# Patient Record
Sex: Female | Born: 1950
Health system: Southern US, Community
[De-identification: ages and names within clinical notes are randomized; demographics above are authoritative.]

## PROBLEM LIST (undated history)

## (undated) DIAGNOSIS — J45909 Unspecified asthma, uncomplicated: Secondary | ICD-10-CM

## (undated) DIAGNOSIS — E039 Hypothyroidism, unspecified: Secondary | ICD-10-CM

## (undated) DIAGNOSIS — Z8679 Personal history of other diseases of the circulatory system: Secondary | ICD-10-CM

## (undated) DIAGNOSIS — M542 Cervicalgia: Secondary | ICD-10-CM

## (undated) DIAGNOSIS — I209 Angina pectoris, unspecified: Secondary | ICD-10-CM

## (undated) HISTORY — PX: BREAST SURGERY: SHX581

## (undated) HISTORY — DX: Unspecified asthma, uncomplicated: J45.909

## (undated) HISTORY — DX: Angina pectoris, unspecified: I20.9

## (undated) HISTORY — DX: Personal history of other diseases of the circulatory system: Z86.79

## (undated) HISTORY — PX: TUBAL LIGATION: SHX77

## (undated) HISTORY — DX: Cervicalgia: M54.2

## (undated) HISTORY — DX: Hypothyroidism, unspecified: E03.9

## (undated) HISTORY — PX: LASER ABLATION: SHX1947

---

## 2004-11-05 ENCOUNTER — Emergency Department (HOSPITAL_COMMUNITY): Admission: EM | Admit: 2004-11-05 | Discharge: 2004-11-05 | Payer: Self-pay | Admitting: Family Medicine

## 2005-01-20 ENCOUNTER — Ambulatory Visit: Payer: Self-pay | Admitting: Internal Medicine

## 2005-01-24 ENCOUNTER — Ambulatory Visit: Payer: Self-pay | Admitting: Internal Medicine

## 2005-02-27 ENCOUNTER — Ambulatory Visit: Payer: Self-pay | Admitting: Cardiology

## 2005-04-10 ENCOUNTER — Ambulatory Visit: Payer: Self-pay | Admitting: Internal Medicine

## 2005-05-09 ENCOUNTER — Ambulatory Visit: Payer: Self-pay | Admitting: Internal Medicine

## 2005-05-29 ENCOUNTER — Encounter: Admission: RE | Admit: 2005-05-29 | Discharge: 2005-05-29 | Payer: Self-pay | Admitting: Internal Medicine

## 2005-07-16 ENCOUNTER — Ambulatory Visit: Payer: Self-pay | Admitting: Internal Medicine

## 2005-07-25 ENCOUNTER — Other Ambulatory Visit: Admission: RE | Admit: 2005-07-25 | Discharge: 2005-07-25 | Payer: Self-pay | Admitting: Obstetrics and Gynecology

## 2005-09-22 ENCOUNTER — Ambulatory Visit: Payer: Self-pay | Admitting: Cardiology

## 2005-10-03 ENCOUNTER — Ambulatory Visit: Payer: Self-pay | Admitting: Internal Medicine

## 2005-10-09 ENCOUNTER — Encounter: Admission: RE | Admit: 2005-10-09 | Discharge: 2005-10-09 | Payer: Self-pay | Admitting: Orthopedic Surgery

## 2006-06-23 ENCOUNTER — Ambulatory Visit: Payer: Self-pay | Admitting: Cardiology

## 2006-09-07 ENCOUNTER — Ambulatory Visit: Payer: Self-pay | Admitting: Internal Medicine

## 2006-09-10 ENCOUNTER — Ambulatory Visit: Payer: Self-pay | Admitting: Internal Medicine

## 2006-09-23 ENCOUNTER — Ambulatory Visit: Payer: Self-pay | Admitting: Internal Medicine

## 2006-09-23 LAB — CONVERTED CEMR LAB
Albumin: 4.1 g/dL (ref 3.5–5.2)
Alkaline Phosphatase: 107 units/L (ref 39–117)
BUN: 13 mg/dL (ref 6–23)
CO2: 28 meq/L (ref 19–32)
Creatinine, Ser: 0.9 mg/dL (ref 0.4–1.2)
HCT: 37.9 % (ref 36.0–46.0)
MCHC: 33.3 g/dL (ref 30.0–36.0)
Platelets: 247 10*3/uL (ref 150–400)
RDW: 13.5 % (ref 11.5–14.6)
Sodium: 142 meq/L (ref 135–145)
Total Bilirubin: 1 mg/dL (ref 0.3–1.2)
Total Protein: 7.3 g/dL (ref 6.0–8.3)
WBC: 6.2 10*3/uL (ref 4.5–10.5)

## 2006-12-07 ENCOUNTER — Ambulatory Visit: Payer: Self-pay | Admitting: Psychology

## 2006-12-14 ENCOUNTER — Ambulatory Visit: Payer: Self-pay | Admitting: Psychology

## 2006-12-18 ENCOUNTER — Ambulatory Visit: Payer: Self-pay | Admitting: Psychology

## 2006-12-30 ENCOUNTER — Ambulatory Visit: Payer: Self-pay | Admitting: Psychology

## 2007-01-01 ENCOUNTER — Ambulatory Visit: Payer: Self-pay | Admitting: Internal Medicine

## 2007-01-08 ENCOUNTER — Ambulatory Visit: Payer: Self-pay | Admitting: Psychology

## 2007-03-18 ENCOUNTER — Ambulatory Visit: Payer: Self-pay | Admitting: Pulmonary Disease

## 2007-03-18 LAB — CONVERTED CEMR LAB
ALT: 14 units/L (ref 0–40)
AST: 20 units/L (ref 0–37)
Alkaline Phosphatase: 100 units/L (ref 39–117)
BUN: 14 mg/dL (ref 6–23)
Bilirubin, Direct: 0.2 mg/dL (ref 0.0–0.3)
CO2: 27 meq/L (ref 19–32)
Calcium: 9.2 mg/dL (ref 8.4–10.5)
Chloride: 105 meq/L (ref 96–112)
Creatinine, Ser: 0.8 mg/dL (ref 0.4–1.2)
Glucose, Bld: 103 mg/dL — ABNORMAL HIGH (ref 70–99)
Total Bilirubin: 0.8 mg/dL (ref 0.3–1.2)
Total Protein: 7.4 g/dL (ref 6.0–8.3)

## 2007-04-26 ENCOUNTER — Encounter: Payer: Self-pay | Admitting: Internal Medicine

## 2007-04-26 DIAGNOSIS — J45909 Unspecified asthma, uncomplicated: Secondary | ICD-10-CM | POA: Insufficient documentation

## 2007-04-26 DIAGNOSIS — I471 Supraventricular tachycardia: Secondary | ICD-10-CM | POA: Insufficient documentation

## 2007-04-26 DIAGNOSIS — I209 Angina pectoris, unspecified: Secondary | ICD-10-CM

## 2007-04-26 DIAGNOSIS — Z8679 Personal history of other diseases of the circulatory system: Secondary | ICD-10-CM

## 2007-04-26 HISTORY — DX: Angina pectoris, unspecified: I20.9

## 2007-04-26 HISTORY — DX: Unspecified asthma, uncomplicated: J45.909

## 2007-04-26 HISTORY — DX: Personal history of other diseases of the circulatory system: Z86.79

## 2007-06-15 ENCOUNTER — Ambulatory Visit: Payer: Self-pay | Admitting: Internal Medicine

## 2007-06-15 DIAGNOSIS — H10509 Unspecified blepharoconjunctivitis, unspecified eye: Secondary | ICD-10-CM | POA: Insufficient documentation

## 2007-06-21 ENCOUNTER — Ambulatory Visit: Payer: Self-pay | Admitting: Cardiology

## 2008-02-01 ENCOUNTER — Ambulatory Visit: Payer: Self-pay | Admitting: Internal Medicine

## 2008-02-01 DIAGNOSIS — M542 Cervicalgia: Secondary | ICD-10-CM

## 2008-02-01 HISTORY — DX: Cervicalgia: M54.2

## 2008-05-16 ENCOUNTER — Telehealth: Payer: Self-pay | Admitting: Adult Health

## 2008-08-31 ENCOUNTER — Ambulatory Visit: Payer: Self-pay | Admitting: Cardiology

## 2009-01-30 ENCOUNTER — Ambulatory Visit: Payer: Self-pay | Admitting: Internal Medicine

## 2009-02-14 ENCOUNTER — Encounter: Payer: Self-pay | Admitting: Internal Medicine

## 2009-02-22 ENCOUNTER — Telehealth: Payer: Self-pay | Admitting: Internal Medicine

## 2009-02-26 ENCOUNTER — Encounter: Payer: Self-pay | Admitting: Cardiology

## 2010-01-04 ENCOUNTER — Encounter: Payer: Self-pay | Admitting: Internal Medicine

## 2010-01-10 ENCOUNTER — Encounter: Payer: Self-pay | Admitting: Internal Medicine

## 2010-02-07 DIAGNOSIS — E039 Hypothyroidism, unspecified: Secondary | ICD-10-CM

## 2010-02-07 HISTORY — DX: Hypothyroidism, unspecified: E03.9

## 2010-02-11 ENCOUNTER — Ambulatory Visit: Payer: Self-pay | Admitting: Cardiology

## 2010-09-10 ENCOUNTER — Ambulatory Visit: Payer: Self-pay | Admitting: Internal Medicine

## 2010-11-07 ENCOUNTER — Ambulatory Visit
Admission: RE | Admit: 2010-11-07 | Discharge: 2010-11-07 | Payer: Self-pay | Source: Home / Self Care | Attending: Internal Medicine | Admitting: Internal Medicine

## 2010-11-07 LAB — CONVERTED CEMR LAB
Ketones, urine, test strip: NEGATIVE
Nitrite: NEGATIVE
Specific Gravity, Urine: 1.025
Urobilinogen, UA: 0.2

## 2010-11-24 ENCOUNTER — Encounter: Payer: Self-pay | Admitting: Internal Medicine

## 2010-12-03 NOTE — Assessment & Plan Note (Signed)
Summary: f1y/jss  Medications Added NITROLINGUAL 0.4 MG/SPRAY SOLN (NITROGLYCERIN) One spray under tongue every 5 minutes as needed for chest pain---may repeat times three NITROSTAT 0.4 MG SUBL (NITROGLYCERIN) 1 tablet under tongue at onset of chest pain; you may repeat every 5 minutes for up to 3 doses. PROPRANOLOL HCL 40 MG TABS (PROPRANOLOL HCL) as needed      Allergies Added: NKDA  Visit Type:  1 year follow up  CC:  No cardiac complains.  History of Present Illness: She has had two episodes of chest pain, both with positive enzymes, one in [place], and one at [place] in [place].  She had chest pain, the second time were more classic.  Cath revealed no sig CAD.  Both times called spasm.  Has had RFA of SVT and been under the care of Dr. Celedonio Miyamoto.    Current Medications (verified): 1)  Cartia Xt 180 Mg Cp24 (Diltiazem Hcl Coated Beads) .Marland Kitchen.. 1 Once Daily 2)  Synthroid 100 Mcg Tabs (Levothyroxine Sodium) .Marland Kitchen.. 1 Once Daily 3)  Tramadol Hcl 50 Mg Tabs (Tramadol Hcl) .... Take 1 Tablet By Mouth Every Six Hours 4)  Alprazolam 0.5 Mg  Tabs (Alprazolam) .Marland Kitchen.. 1 Two Times A Day As Needed 5)  Proair Hfa 108 (90 Base) Mcg/act  Aers (Albuterol Sulfate) .... Use As Dir 6)  Advair Diskus 250-50 Mcg/dose Misc (Fluticasone-Salmeterol) .... Use As Directed 7)  Nitrolingual 0.4 Mg/spray Soln (Nitroglycerin) .... One Spray Under Tongue Every 5 Minutes As Needed For Chest Pain---May Repeat Times Three 8)  Propranolol Hcl 40 Mg Tabs (Propranolol Hcl) .... As Needed  Allergies (verified): No Known Drug Allergies  Past History:  Past Medical History: Last updated: February 25, 2010 Current Problems:  SUPRAVENTRICULAR TACHYCARDIA, HX OF (ICD-V12.59) CORONARY ARTERY SPASM (ICD-413.9) NECK PAIN (ICD-723.1) BLEPHAROCONJUNCTIVITIS NOS (ICD-372.20) ASTHMA (ICD-493.90) HYPOTHYROIDISM (ICD-244.9)  Past Surgical History: Last updated: February 25, 2010 Tubal ligation Breast biopsy  ablations x2  Family  History: Last updated: February 25, 2010 Father died at age 89 of chronic obstructive pulmonary disease, and mother died at age 8 of cancer. She has 2 brothers who are alive and well. She also has a sister who is alive.  Social History: Last updated: 02-25-2010  She is married to the Microbiologist of [employer].  She has a former career in respiratory therapy, drinks some wine, no tobacco or drugs.  Vital Signs:  Patient profile:   60 year old female Height:      64 inches Weight:      160.50 pounds BMI:     27.65 Pulse rate:   70 / minute Pulse rhythm:   regular Resp:     18 per minute BP sitting:   120 / 76  (left arm) Cuff size:   regular  Vitals Entered By: Vikki Ports (February 11, 2010 4:19 PM)  Physical Exam  General:  Well developed, well nourished, in no acute distress. Head:  normocephalic and atraumatic Eyes:  PERRLA/EOM intact; conjunctiva and lids normal. Lungs:  Clear bilaterally to auscultation and percussion. Heart:  PMI non displaced.  Normal S1 and S2.   Msk:  Back normal, normal gait. Muscle strength and tone normal. Pulses:  pulses normal in all 4 extremities Extremities:  No clubbing or cyanosis. Neurologic:  Alert and oriented x 3.   EKG  Procedure date:  02/11/2010  Findings:      NSR.  Nonspecific ST abnormality  Impression & Recommendations:  Problem # 1:  SUPRAVENTRICULAR TACHYCARDIA, HX OF (ICD-V12.59)  No real  occurence.   Her updated medication list for this problem includes:    Cartia Xt 180 Mg Cp24 (Diltiazem hcl coated beads) .Marland Kitchen... 1 once daily    Nitrostat 0.4 Mg Subl (Nitroglycerin) .Marland Kitchen... 1 tablet under tongue at onset of chest pain; you may repeat every 5 minutes for up to 3 doses.    Propranolol Hcl 40 Mg Tabs (Propranolol hcl) .Marland Kitchen... As needed  Orders: EKG w/ Interpretation (93000)  Problem # 2:  CORONARY ARTERY SPASM (ICD-413.9)  Not clear about spasm or not.  Could have been another mechanism.   Her updated  medication list for this problem includes:    Cartia Xt 180 Mg Cp24 (Diltiazem hcl coated beads) .Marland Kitchen... 1 once daily    Nitrostat 0.4 Mg Subl (Nitroglycerin) .Marland Kitchen... 1 tablet under tongue at onset of chest pain; you may repeat every 5 minutes for up to 3 doses.    Propranolol Hcl 40 Mg Tabs (Propranolol hcl) .Marland Kitchen... As needed  Orders: EKG w/ Interpretation (93000)  Patient Instructions: 1)  Your physician recommends that you schedule a follow-up appointment in: 1year 2)  Your physician recommends that you continue on your current medications as directed. Please refer to the Current Medication list given to you today. Prescriptions: PROPRANOLOL HCL 40 MG TABS (PROPRANOLOL HCL) as needed  #30 x 2   Entered by:   Lisabeth Devoid RN   Authorized by:   Ronaldo Miyamoto, MD, [facility]   Signed by:   Lisabeth Devoid RN on 02/11/2010   Method used:   Electronically to        [facility]* (retail)       [address]       [address] Shipping/mailing       [place], [place]  [address]       Ph: [phone]       Fax: [phone]   RxID:   [id number] NITROSTAT 0.4 MG SUBL (NITROGLYCERIN) 1 tablet under tongue at onset of chest pain; you may repeat every 5 minutes for up to 3 doses.  #25 x 2   Entered by:   Lisabeth Devoid RN   Authorized by:   Ronaldo Miyamoto, MD, [facility]   Signed by:   Lisabeth Devoid RN on 02/11/2010   Method used:   Electronically to        [facility]* (retail)       [address]       [address] Shipping/mailing       [place], [place]  [address]       Ph: [phone]       Fax: [phone]   RxID:   [id number]

## 2010-12-03 NOTE — Assessment & Plan Note (Signed)
Summary: MED CHECK/REFILLS/CJR   Vital Signs:  Patient profile:   60 year old female Weight:      164 pounds Temp:     98.1 degrees F oral BP sitting:   114 / 70  (right arm) Cuff size:   regular  Vitals Entered By: Duard [place] LPN (September 10, 2010 12:49 PM) CC: medication review with refills Is Patient Diabetic? No Flu Vaccine Consent Questions     Do you have a history of severe allergic reactions to this vaccine? no    Any prior history of allergic reactions to egg and/or gelatin? no    Do you have a sensitivity to the preservative Thimersol? no    Do you have a past history of Guillan-Barre Syndrome? no    Do you currently have an acute febrile illness? no    Have you ever had a severe reaction to latex? no    Vaccine information given and explained to patient? yes    Are you currently pregnant? no    Lot Number:[id number]   Exp Date:05/03/2011   Site Given  right Deltoid IM   CC:  medication review with refills.  History of Present Illness: 60 year old patient who is followed by cardiology due to SVT status post RFA x 2.  Kristin Leonard has a history of coronary artery spasm.  She is doing quite well today.  She needs medication refills.  She has a history of asthma and hypothyroidism.  Preventive Screening-Counseling & Management  Alcohol-Tobacco     Smoking Status: never  Allergies (verified): No Known Drug Allergies  Past History:  Past Medical History: Reviewed history from 02/07/2010 and no changes required. Current Problems:  SUPRAVENTRICULAR TACHYCARDIA, HX OF (ICD-V12.59) CORONARY ARTERY SPASM (ICD-413.9) NECK PAIN (ICD-723.1) BLEPHAROCONJUNCTIVITIS NOS (ICD-372.20) ASTHMA (ICD-493.90) HYPOTHYROIDISM (ICD-244.9)  Review of Systems  The patient denies anorexia, fever, weight loss, weight gain, vision loss, decreased hearing, hoarseness, chest pain, syncope, dyspnea on exertion, peripheral edema, prolonged cough, headaches, hemoptysis, abdominal  pain, melena, hematochezia, severe indigestion/heartburn, hematuria, incontinence, genital sores, muscle weakness, suspicious skin lesions, transient blindness, difficulty walking, depression, unusual weight change, abnormal bleeding, enlarged lymph nodes, angioedema, and breast masses.    Physical Exam  General:  Well-developed,well-nourished,in no acute distress; alert,appropriate and cooperative throughout examination Head:  Normocephalic and atraumatic without obvious abnormalities. No apparent alopecia or balding. Mouth:  Oral mucosa and oropharynx without lesions or exudates.  Teeth in good repair. Neck:  No deformities, masses, or tenderness noted. Lungs:  Normal respiratory effort, chest expands symmetrically. Lungs are clear to auscultation, no crackles or wheezes. Heart:  Normal rate and regular rhythm. S1 and S2 normal without gallop, murmur, click, rub or other extra sounds. Abdomen:  Bowel sounds positive,abdomen soft and non-tender without masses, organomegaly or hernias noted. Msk:  No deformity or scoliosis noted of thoracic or lumbar spine.   Pulses:  R and L carotid,radial,femoral,dorsalis pedis and posterior tibial pulses are full and equal bilaterally Extremities:  No clubbing, cyanosis, edema, or deformity noted with normal full range of motion of all joints.     Impression & Recommendations:  Problem # 1:  CORONARY ARTERY SPASM (ICD-413.9)  Her updated medication list for this problem includes:    Cartia Xt 180 Mg Cp24 (Diltiazem hcl coated beads) .Marland Kitchen... 1 once daily    Nitrostat 0.4 Mg Subl (Nitroglycerin) .Marland Kitchen... 1 tablet under tongue at onset of chest pain; you may repeat every 5 minutes for up to 3 doses.    Propranolol Hcl 40  Mg Tabs (Propranolol hcl) .Marland Kitchen... As needed  Problem # 2:  ASTHMA (ICD-493.90)  Her updated medication list for this problem includes:    Proair Hfa 108 (90 Base) Mcg/act Aers (Albuterol sulfate) ..... Use as dir    Advair Diskus 250-50  Mcg/dose Misc (Fluticasone-salmeterol) ..... Use as directed  Problem # 3:  HYPOTHYROIDISM (ICD-244.9)  Her updated medication list for this problem includes:    Synthroid 100 Mcg Tabs (Levothyroxine sodium) .Marland Kitchen... 1 once daily  Complete Medication List: 1)  Cartia Xt 180 Mg Cp24 (Diltiazem hcl coated beads) .Marland Kitchen.. 1 once daily 2)  Synthroid 100 Mcg Tabs (Levothyroxine sodium) .Marland Kitchen.. 1 once daily 3)  Tramadol Hcl 50 Mg Tabs (Tramadol hcl) .... Take 1 tablet by mouth every six hours 4)  Alprazolam 0.5 Mg Tabs (Alprazolam) .Marland Kitchen.. 1 two times a day as needed 5)  Proair Hfa 108 (90 Base) Mcg/act Aers (Albuterol sulfate) .... Use as dir 6)  Advair Diskus 250-50 Mcg/dose Misc (Fluticasone-salmeterol) .... Use as directed 7)  Nitrostat 0.4 Mg Subl (Nitroglycerin) .Marland Kitchen.. 1 tablet under tongue at onset of chest pain; you may repeat every 5 minutes for up to 3 doses. 8)  Propranolol Hcl 40 Mg Tabs (Propranolol hcl) .... As needed  Other Orders: Admin 1st Vaccine (10272) Flu Vaccine 52yrs + (53664)  Patient Instructions: 1)  Please schedule a follow-up appointment in 6 months. 2)  Limit your Sodium (Salt) to less than 2 grams a day(slightly less than 1/2 a teaspoon) to prevent fluid retention, swelling, or worsening of symptoms. 3)  It is important that you exercise regularly at least 20 minutes 5 times a week. If you develop chest pain, have severe difficulty breathing, or feel very tired , stop exercising immediately and seek medical attention. Prescriptions: ADVAIR DISKUS 250-50 MCG/DOSE MISC (FLUTICASONE-SALMETEROL) Use as directed  #3 x 3   Entered and Authorized by:   Gordy Savers  MD   Signed by:   Gordy Savers  MD on 09/10/2010   Method used:   Electronically to        [facility]* (retail)       [address]       [address] Shipping/mailing       [place], [place]  [address]       Ph: [phone]       Fax: [phone]   RxID:   [id number] PROAIR HFA  108 (90 BASE) MCG/ACT  AERS (ALBUTEROL SULFATE) use as dir Brand medically necessary #3 Pack x 1   Entered and Authorized by:   Gordy Savers  MD   Signed by:   Gordy Savers  MD on 09/10/2010   Method used:   Electronically to        [facility]* (retail)       [address]       [address] Shipping/mailing       [place], [place]  [address]       Ph: [phone]       Fax: [phone]   RxID:   [id number] TRAMADOL HCL 50 MG TABS (TRAMADOL HCL) Take 1 tablet by mouth every six hours  #90 x 6   Entered and Authorized by:   Gordy Savers  MD   Signed by:   Gordy Savers  MD on 09/10/2010   Method used:   Electronically to        [facility]* (  retail)       [address]       [address] Shipping/mailing       [place], [place]  [address]       Ph: [phone]       Fax: [phone]   RxID:   [id number] SYNTHROID 100 MCG TABS (LEVOTHYROXINE SODIUM) 1 once daily  #90 x 4   Entered and Authorized by:   Gordy Savers  MD   Signed by:   Gordy Savers  MD on 09/10/2010   Method used:   Electronically to        [facility]* (retail)       [address]       [address] Shipping/mailing       [place], [place]  [address]       Ph: [phone]       Fax: [phone]   RxID:   [id number] CARTIA XT 180 MG CP24 (DILTIAZEM HCL COATED BEADS) 1 once daily  #90 x 4   Entered and Authorized by:   Gordy Savers  MD   Signed by:   Gordy Savers  MD on 09/10/2010   Method used:   Electronically to        [facility]* (retail)       [address]       [address] Shipping/mailing       [place], [place]  [address]       Ph: [phone]       Fax: [phone]   RxID:   [id number] ADVAIR DISKUS 250-50 MCG/DOSE MISC (FLUTICASONE-SALMETEROL) Use as directed  #3 x 3   Entered and Authorized by:   Gordy Savers  MD   Signed by:   Gordy Savers  MD on 09/10/2010   Method used:   Print then Mail to Patient   RxID:   [id number] PROAIR HFA 108 (90 BASE) MCG/ACT  AERS (ALBUTEROL SULFATE) use as dir Brand medically necessary #3 Pack x 1   Entered and Authorized by:   Gordy Savers  MD   Signed by:   Gordy Savers  MD on 09/10/2010   Method used:   Print then Mail to Patient   RxID:   [id number] ALPRAZOLAM 0.5 MG  TABS (ALPRAZOLAM) 1 two times a day as needed  #60 x 1   Entered and Authorized by:   Gordy Savers  MD   Signed by:   Gordy Savers  MD on 09/10/2010   Method used:   Print then Mail to Patient   RxID:   [id number] TRAMADOL HCL 50 MG TABS (TRAMADOL HCL) Take 1 tablet by mouth every six hours  #90 x 6   Entered and Authorized by:   Gordy Savers  MD   Signed by:   Gordy Savers  MD on 09/10/2010   Method used:   Print then Mail to Patient   RxID:   [id number] SYNTHROID 100 MCG TABS (LEVOTHYROXINE SODIUM) 1 once daily  #90 x 4   Entered and Authorized by:   Gordy Savers  MD   Signed by:   Gordy Savers  MD on 09/10/2010   Method used:   Print then Mail to Patient   RxID:   [id number] CARTIA XT 180 MG CP24 (DILTIAZEM HCL COATED BEADS) 1 once daily  #90 x 4   Entered and Authorized  by:   Gordy Savers  MD   Signed by:   Gordy Savers  MD on 09/10/2010   Method used:   Print then Mail to Patient   RxID:   [id number]    Orders Added: 1)  Admin 1st Vaccine [90471] 2)  Flu Vaccine 46yrs + [14782] 3)  Est. Patient Level III [95621]

## 2010-12-04 ENCOUNTER — Encounter: Payer: Self-pay | Admitting: Internal Medicine

## 2010-12-04 ENCOUNTER — Ambulatory Visit (INDEPENDENT_AMBULATORY_CARE_PROVIDER_SITE_OTHER): Payer: 59 | Admitting: Internal Medicine

## 2010-12-04 VITALS — BP 120/80 | HR 66 | Temp 98.2°F | Resp 16 | Ht 63.75 in | Wt 166.0 lb

## 2010-12-04 DIAGNOSIS — J069 Acute upper respiratory infection, unspecified: Secondary | ICD-10-CM

## 2010-12-04 MED ORDER — HYDROCODONE-HOMATROPINE 5-1.5 MG/5ML PO SYRP: 5.0000 mL | ORAL_SOLUTION | Freq: Four times a day (QID) | ORAL | Status: AC | PRN

## 2010-12-04 NOTE — Patient Instructions (Signed)
Return in 3 months for follow-up Get plenty of rest, Drink lots of  clear liquids, and use Tylenol or ibuprofen for fever and discomfort.

## 2010-12-04 NOTE — Progress Notes (Signed)
  Subjective:    Patient ID: Kristin Leonard, female    DOB: August 24, 1951, 60 y.o.   MRN: [id number]  HPI  60 -year-old patient who has a  history of asthma.  The last 5 days.  She has had head congestion, sore throat, and nonproductive cough.  The cough is worse at night.  She has been using albuterol prophylactically, but there is been no wheezing.  She has felt poorly with fatigue for 5 days.  Associated symptoms include hoarseness.  Denies any fever or chills.   Review of Systems  Constitutional: Negative.   HENT: Positive for congestion, sore throat, voice change and sinus pressure. Negative for hearing loss, facial swelling, rhinorrhea, mouth sores, trouble swallowing, neck pain, neck stiffness, dental problem, tinnitus and ear discharge.   Eyes: Negative for pain, discharge and visual disturbance.  Respiratory: Positive for cough. Negative for shortness of breath and wheezing.   Cardiovascular: Negative for chest pain, palpitations and leg swelling.  Gastrointestinal: Negative for nausea, vomiting, abdominal pain, diarrhea, constipation, blood in stool and abdominal distention.  Genitourinary: Negative for dysuria, urgency, frequency, hematuria, flank pain, vaginal bleeding, vaginal discharge, difficulty urinating, vaginal pain and pelvic pain.  Musculoskeletal: Negative for joint swelling, arthralgias and gait problem.  Skin: Negative for rash.  Neurological: Negative for dizziness, syncope, speech difficulty, weakness, numbness and headaches.  Hematological: Negative for adenopathy. Does not bruise/bleed easily.  Psychiatric/Behavioral: Negative for behavioral problems, dysphoric mood and agitation. The patient is not nervous/anxious.        Objective:   Physical Exam  Constitutional: She is oriented to person, place, and time. She appears well-developed and well-nourished.  HENT:  Head: Normocephalic.  Right Ear: External ear normal.  Left Ear: External ear normal.       Slight  injection of the oropharynx  Eyes: Conjunctivae and EOM are normal. Pupils are equal, round, and reactive to light.  Neck: Normal range of motion. Neck supple. No thyromegaly present.  Cardiovascular: Normal rate, regular rhythm, normal heart sounds and intact distal pulses.   Pulmonary/Chest: Effort normal and breath sounds normal.  Abdominal: Soft. Bowel sounds are normal. She exhibits no mass. There is no tenderness.  Musculoskeletal: Normal range of motion.  Lymphadenopathy:    She has no cervical adenopathy.  Neurological: She is alert and oriented to person, place, and time.  Skin: Skin is warm and dry. No rash noted.  Psychiatric: She has a normal mood and affect. Her behavior is normal.          Assessment & Plan:  1. URI.  Will treat symptomatically.  Also, given a prescription for Hydromet to assist with her most problematic symptom which is cough nocturnally.  Will force fluids 2. Asthma-Presently  stable in spite of recent URI; Will continue  maintenance medications

## 2010-12-05 NOTE — Assessment & Plan Note (Signed)
Summary: UA/dm   Allergies: No Known Drug Allergies   Complete Medication List: 1)  Cartia Xt 180 Mg Cp24 (Diltiazem hcl coated beads) .Marland Kitchen.. 1 once daily 2)  Synthroid 100 Mcg Tabs (Levothyroxine sodium) .Marland Kitchen.. 1 once daily 3)  Tramadol Hcl 50 Mg Tabs (Tramadol hcl) .... Take 1 tablet by mouth every six hours 4)  Alprazolam 0.5 Mg Tabs (Alprazolam) .Marland Kitchen.. 1 two times a day as needed 5)  Proair Hfa 108 (90 Base) Mcg/act Aers (Albuterol sulfate) .... Use as dir 6)  Advair Diskus 250-50 Mcg/dose Misc (Fluticasone-salmeterol) .... Use as directed 7)  Nitrostat 0.4 Mg Subl (Nitroglycerin) .Marland Kitchen.. 1 tablet under tongue at onset of chest pain; you may repeat every 5 minutes for up to 3 doses. 8)  Propranolol Hcl 40 Mg Tabs (Propranolol hcl) .... As needed  Other Orders: Specimen Handling (47829) UA Dipstick w/o Micro (automated)  (81003)   Orders Added: 1)  Specimen Handling [99000] 2)  UA Dipstick w/o Micro (automated)  [81003]    Laboratory Results   Urine Tests    Routine Urinalysis   Color: yellow Appearance: Clear Glucose: negative   (Normal Range: Negative) Bilirubin: negative   (Normal Range: Negative) Ketone: negative   (Normal Range: Negative) Spec. Gravity: 1.025   (Normal Range: 1.003-1.035) Blood: trace-intact   (Normal Range: Negative) pH: 6.5   (Normal Range: 5.0-8.0) Protein: negative   (Normal Range: Negative) Urobilinogen: 0.2   (Normal Range: 0-1) Nitrite: negative   (Normal Range: Negative) Leukocyte Esterace: trace   (Normal Range: Negative)    Comments: Rita Ohara  November 07, 2010 3:09 PM

## 2010-12-09 ENCOUNTER — Telehealth: Payer: Self-pay | Admitting: *Deleted

## 2010-12-09 NOTE — Telephone Encounter (Signed)
Pt states she will get samples at her office at [facility].

## 2010-12-09 NOTE — Telephone Encounter (Signed)
Pt would like an antibiotic.  She is still coughing with an loose, non productive cough and sore throat.

## 2010-12-09 NOTE — Telephone Encounter (Signed)
Patient has viral URI- continue present Rx; does she need additional  Cough meds?  Call if she develops fever, wheezing of sob

## 2010-12-11 NOTE — Assessment & Plan Note (Signed)
Summary: sinusitis?   Allergies: No Known Drug Allergies   Complete Medication List: 1)  Cartia Xt 180 Mg Cp24 (Diltiazem hcl coated beads) .Marland Kitchen.. 1 once daily 2)  Synthroid 100 Mcg Tabs (Levothyroxine sodium) .Marland Kitchen.. 1 once daily 3)  Tramadol Hcl 50 Mg Tabs (Tramadol hcl) .... Take 1 tablet by mouth every six hours 4)  Alprazolam 0.5 Mg Tabs (Alprazolam) .Marland Kitchen.. 1 two times a day as needed 5)  Proair Hfa 108 (90 Base) Mcg/act Aers (Albuterol sulfate) .... Use as dir 6)  Advair Diskus 250-50 Mcg/dose Misc (Fluticasone-salmeterol) .... Use as directed 7)  Nitrostat 0.4 Mg Subl (Nitroglycerin) .Marland Kitchen.. 1 tablet under tongue at onset of chest pain; you may repeat every 5 minutes for up to 3 doses. 8)  Propranolol Hcl 40 Mg Tabs (Propranolol hcl) .... As needed 9)  Ciprofloxacin Hcl 500 Mg Tabs (Ciprofloxacin hcl) .... One twice daily

## 2010-12-23 ENCOUNTER — Emergency Department (HOSPITAL_BASED_OUTPATIENT_CLINIC_OR_DEPARTMENT_OTHER)
Admission: EM | Admit: 2010-12-23 | Discharge: 2010-12-23 | Disposition: A | Discharge status: Home / Self Care | Payer: 59 | Attending: Emergency Medicine | Admitting: Emergency Medicine

## 2010-12-23 ENCOUNTER — Emergency Department (INDEPENDENT_AMBULATORY_CARE_PROVIDER_SITE_OTHER): Payer: 59

## 2010-12-23 DIAGNOSIS — Y92009 Unspecified place in unspecified non-institutional (private) residence as the place of occurrence of the external cause: Secondary | ICD-10-CM | POA: Insufficient documentation

## 2010-12-23 DIAGNOSIS — S82409A Unspecified fracture of shaft of unspecified fibula, initial encounter for closed fracture: Secondary | ICD-10-CM

## 2010-12-23 DIAGNOSIS — S82109A Unspecified fracture of upper end of unspecified tibia, initial encounter for closed fracture: Secondary | ICD-10-CM | POA: Insufficient documentation

## 2010-12-23 DIAGNOSIS — W19XXXA Unspecified fall, initial encounter: Secondary | ICD-10-CM

## 2010-12-24 ENCOUNTER — Ambulatory Visit (HOSPITAL_COMMUNITY)
Admission: RE | Admit: 2010-12-24 | Discharge: 2010-12-24 | Disposition: A | Discharge status: Home / Self Care | Payer: 59 | Source: Ambulatory Visit | Attending: Orthopedic Surgery | Admitting: Orthopedic Surgery

## 2010-12-24 ENCOUNTER — Other Ambulatory Visit (HOSPITAL_COMMUNITY): Payer: Self-pay | Admitting: Orthopedic Surgery

## 2010-12-24 DIAGNOSIS — S8000XA Contusion of unspecified knee, initial encounter: Secondary | ICD-10-CM | POA: Insufficient documentation

## 2010-12-24 DIAGNOSIS — S82409A Unspecified fracture of shaft of unspecified fibula, initial encounter for closed fracture: Secondary | ICD-10-CM | POA: Insufficient documentation

## 2010-12-24 DIAGNOSIS — R52 Pain, unspecified: Secondary | ICD-10-CM

## 2010-12-24 DIAGNOSIS — X58XXXA Exposure to other specified factors, initial encounter: Secondary | ICD-10-CM | POA: Insufficient documentation

## 2010-12-24 DIAGNOSIS — M25569 Pain in unspecified knee: Secondary | ICD-10-CM | POA: Insufficient documentation

## 2010-12-24 DIAGNOSIS — S838X9A Sprain of other specified parts of unspecified knee, initial encounter: Secondary | ICD-10-CM | POA: Insufficient documentation

## 2010-12-25 ENCOUNTER — Other Ambulatory Visit (HOSPITAL_COMMUNITY): Payer: Self-pay | Admitting: Orthopedic Surgery

## 2010-12-25 DIAGNOSIS — M25561 Pain in right knee: Secondary | ICD-10-CM

## 2011-01-02 ENCOUNTER — Other Ambulatory Visit (HOSPITAL_COMMUNITY): Payer: 59

## 2011-02-19 ENCOUNTER — Other Ambulatory Visit: Payer: Self-pay | Admitting: *Deleted

## 2011-02-19 MED ORDER — PROPRANOLOL HCL 40 MG PO TABS: 40.0000 mg | ORAL_TABLET | Freq: Three times a day (TID) | ORAL | Status: DC

## 2011-02-20 ENCOUNTER — Other Ambulatory Visit: Payer: Self-pay

## 2011-02-20 MED ORDER — ALPRAZOLAM 0.5 MG PO TABS: 0.5000 mg | ORAL_TABLET | Freq: Two times a day (BID) | ORAL | Status: DC | PRN

## 2011-02-20 NOTE — Telephone Encounter (Signed)
[facility] 

## 2011-03-18 ENCOUNTER — Ambulatory Visit: Payer: Self-pay | Admitting: Internal Medicine

## 2011-03-18 DIAGNOSIS — Z0289 Encounter for other administrative examinations: Secondary | ICD-10-CM

## 2011-03-18 NOTE — Assessment & Plan Note (Signed)
[facility]                            CARDIOLOGY OFFICE NOTE   Kristin Leonard, Kristin Leonard                    MRN:          [id number]  DATE:06/21/2007                            DOB:          November 24, 1950    Kristin Leonard is in for a followup visit.  In general she has been doing  extremely well.  She has not had any recurrent chest pain.  She asks me  about the possibility of coming off her Imdur.  We talked about that as  well.  Since I last her she has seen Dr. __________ .  The patient has  been on Cardizem CD and has not had significant recurrence of  arrhythmia.  They have recommended continuing that.  She has not been  started back on Rythmol.   CURRENT MEDICATIONS:  1. Imdur 30 mg daily.  2. Synthroid 0.1 mg daily.  3. Cardizem long acting 180 mg daily.   PHYSICAL EXAMINATION:  Blood pressure is 100/88, pulse is 72.  LUNGS:  Fields are clear.  CARDIAC:  Rhythm is regular.   Her EKG reveals normal sinus rhythm and is essentially within normal  limits.   IMPRESSION:  1. History of possible coronary artery spasm in the past.  2. History of supraventricular tachycardia, status post ablation, on      Cardizem therapy.  3. History of hyperthyroidism, status post radioactive ablation now on      replacement therapy.   RECOMMENDATIONS:  1. Continue current medical regimen.  2. She will go down to Imdur 15 mg daily for the next 2 months and      then stop it all together.  He has a prescription for nitroglycerin      to be used as needed.     Kristin Morton. Riley Kill, MD, [facility]  Electronically Signed    TDS/MedQ  DD: 06/21/2007  DT: 06/21/2007  Job #: [id number]

## 2011-03-18 NOTE — Assessment & Plan Note (Signed)
[facility]                            CARDIOLOGY OFFICE NOTE   Kristin Leonard, Kristin Leonard                    MRN:          [id number]  DATE:08/31/2008                            DOB:          1951-01-08    Kristin Leonard is in for followup.  She is really doing quite well.  She  may have had one episode since I saw her last of either SVT or chest  pain.  She did have an episode of chest pain, but none of the associated  symptoms are similar to what she had at [facility].  She denies any  ongoing current symptoms.   Her medications include Synthroid 0.1 mg daily and Cardizem 180 mg  daily.   On physical, her blood pressure is 110/78, pulse 64.  The lung fields  are clear, and the cardiac rhythm is regular.   The electrocardiogram demonstrates normal sinus rhythm and essentially  within normal limits.   IMPRESSION:  1. Status post ablation by Dr. __________ with an ectopic atrial      tachycardia or an atrial flutter with variable block.  2. Question history of coronary spasm.   PLAN:  1. Continue current medical regimen.  2. Return to clinic in 1 year.  3. Continued followup with her primary care, Dr. Amador Leonard and [facility]      Arturo Morton. Riley Kill, MD, [facility]  Electronically Signed    TDS/MedQ  DD: 08/31/2008  DT: 09/01/2008  Job #: [id number]   cc:   Gordy Savers, MD

## 2011-03-21 NOTE — Assessment & Plan Note (Signed)
[facility]                              CARDIOLOGY OFFICE NOTE   DELIA, SLATTEN                    MRN:          [id number]  DATE:06/23/2006                            DOB:          [birth year]    HISTORY OF PRESENT ILLNESS:  Mrs. Kristin Leonard is in for a follow up visit.  She  has not been having any ongoing chest pain.  She feels well.  She has been  taking dual antispasm therapy.  She is now down to Imdur 30 mg daily, with  Cardizem 180 mg daily as well as Synthroid 0.1 mg daily.  She is followed by  Dr. Amador Cunas.  She has not had recurrent arrhythmia as well, and saw Dr.  Lorre Munroe at the [facility] [place].   PHYSICAL EXAMINATION:  VITAL SIGNS:  Blood pressure 120/78, pulse 76.  LUNGS:  Fields are clear.  CARDIAC:  Rhythm is regular.  No significant murmurs appreciated.   STUDIES:  Electrocardiogram demonstrates normal sinus rhythm, essentially  within normal limits.  The QT interval is normal.   IMPRESSION:  1. History of coronary artery spasms with documented electrocardiographic      changes, positive enzymes, and no significant coronary obstruction      without evidence of __________.  2. History of supraventricular tachycardia status post electrophysiologic      study and ablation therapy in October 2003.   PLAN:  1. Continue current medical regimen.  2. Return to clinic in one year.  3. Renew nitroglycerin prescription.                              Arturo Morton. Riley Kill, MD, [facility]    TDS/MedQ  DD:  06/23/2006  DT:  06/24/2006  Job #:  [id number]

## 2011-03-21 NOTE — Assessment & Plan Note (Signed)
Kristin Leonard                           Kristin Leonard   Kristin Leonard, Kristin Leonard                    MRN:          [id number]  DATE:09/07/2006                            DOB:          1950/12/16    CHIEF COMPLAINT:  Left lower quadrant pain.   HISTORY:  A 60 year old white woman that has had several months of a vague  left lower quadrant pain becoming more constant, saw her gynecologist, Dr.  Renaldo Leonard, had an ultrasound that showed some fibroids but normal ovaries.  There are no bowel habit changes, bleeding, etc.  Eating or position changes  do not seem to affect it.  Sort of a nagging pain.   MEDICATIONS:  1. Imdur 30 mg daily.  2. Synthroid 0.1 mg daily.  3. Cardizem 180 mg daily.  4. Albuterol p.r.n.  5. Nitroglycerin p.r.n.  6. Xanax p.r.n.   DRUG ALLERGIES:  None known.   PAST MEDICAL HISTORY:  1. Coronary spasm problems.  2. SET with ablations x2 followed at [facility] of [place].  3. Asthma.  4. History of hyperthyroidism status post radioactive ablation, now on      replacement therapy.  5. Prior tubal ligation, 1988.   FAMILY HISTORY:  No colon cancer, and our review was otherwise negative.   SOCIAL HISTORY:  She is married to the Kristin of [employer]  Leonard.  She has a former career in respiratory therapy, drinks some wine,  no tobacco or drugs.   REVIEW OF SYSTEMS:  Eye-glasses.  Allergy problems.  Joint pain.  Some back  pain problems.  All other systems negative.   PHYSICAL EXAMINATION:  GENERAL:  Well-developed, well-nourished white woman  in no acute distress.  VITAL SIGNS:  Height 5 feet 4 inches.  Weight 155 pounds, blood pressure  106/70, pulse 80.  EYES:  Anicteric.  ENT:  Normal nodes, lymphs, ears.  NECK:  Supple.  LUNGS:  Clear.  HEART:  S1, S2, no rubs or gallops.  ABDOMEN:  Soft.  There is some mild tenderness in the left lower quadrant  and groin area.  There is no obvious  hernia.  There is no mass.  RECTAL:  Deferred to colonoscopy.  EXTREMITIES:  No edema noted in the upper extremities.  NEUROLOGIC/PSYCH:  She is alert and oriented x3.   ASSESSMENT:  1. Left lower quadrant pain, unclear etiology.  Pelvic ultrasound      unrevealing.  There are no associated colonic symptoms.  2. Over 57 of age for colon cancer screening.   PLAN:  Investigate symptoms and screen with colonoscopy.  If the colonoscopy  is unrevealing, I would probably monitor things.  It may be a  musculoskeletal process.   I have explained the risks, benefits, indications of the procedure.  She  understands and agrees to proceed.     Kristin Boop, MD,FACG  Electronically Signed    CEG/MedQ  DD: 09/07/2006  DT: 09/07/2006  Job #: [id number]   cc:   Kristin Savers, MD  Kristin Cairo, MD

## 2011-04-15 ENCOUNTER — Inpatient Hospital Stay (INDEPENDENT_AMBULATORY_CARE_PROVIDER_SITE_OTHER)
Admission: RE | Admit: 2011-04-15 | Discharge: 2011-04-15 | Disposition: A | Discharge status: Home / Self Care | Payer: 59 | Source: Ambulatory Visit | Attending: Emergency Medicine | Admitting: Emergency Medicine

## 2011-04-15 DIAGNOSIS — H109 Unspecified conjunctivitis: Secondary | ICD-10-CM

## 2011-08-13 ENCOUNTER — Encounter: Payer: Self-pay | Admitting: Cardiology

## 2011-08-13 ENCOUNTER — Ambulatory Visit (INDEPENDENT_AMBULATORY_CARE_PROVIDER_SITE_OTHER): Payer: 59 | Admitting: Cardiology

## 2011-08-13 DIAGNOSIS — I059 Rheumatic mitral valve disease, unspecified: Secondary | ICD-10-CM

## 2011-08-13 DIAGNOSIS — I209 Angina pectoris, unspecified: Secondary | ICD-10-CM

## 2011-08-13 DIAGNOSIS — Z8679 Personal history of other diseases of the circulatory system: Secondary | ICD-10-CM

## 2011-08-13 DIAGNOSIS — I341 Nonrheumatic mitral (valve) prolapse: Secondary | ICD-10-CM

## 2011-08-13 NOTE — Progress Notes (Signed)
HPI:  She is doing well.  She is not having any issues at present.  No chest pain and no recurrent SVT.  Has not had echo in many years, and does carry diagnosis of MVP.  No exertional shortness of breath.   Current Outpatient Prescriptions  Medication Sig Dispense Refill  . albuterol (PROAIR HFA) 108 (90 BASE) MCG/ACT inhaler Inhale 2 puffs into the lungs every 6 (six) hours as needed.        . ALPRAZolam (XANAX) 0.5 MG tablet Take 1 tablet (0.5 mg total) by mouth 2 (two) times daily as needed.  60 tablet  1  . diltiazem (CARDIZEM CD) 180 MG 24 hr capsule Take 180 mg by mouth daily.        Marland Kitchen levothyroxine (SYNTHROID, LEVOTHROID) 100 MCG tablet Take 100 mcg by mouth daily.        . nitroGLYCERIN (NITROSTAT) 0.4 MG SL tablet Place 0.4 mg under the tongue every 5 (five) minutes as needed. Not to exceed 3 doses       . propranolol (INDERAL) 40 MG tablet Take 1 tablet (40 mg total) by mouth 3 (three) times daily. As needed  30 tablet  2  . traMADol (ULTRAM) 50 MG tablet Take 50 mg by mouth every 6 (six) hours as needed.          No Known Allergies  Past Medical History  Diagnosis Date  . HYPOTHYROIDISM 02/07/2010  . CORONARY ARTERY SPASM 04/26/2007  . ASTHMA 04/26/2007  . NECK PAIN 02/01/2008  . SUPRAVENTRICULAR TACHYCARDIA, HX OF 04/26/2007    Past Surgical History  Procedure Date  . Tubal ligation   . Breast surgery     biopsy  . Laser ablation     x2    No family history on file.  History   Social History  . Marital Status: Married    Spouse Name: N/A    Number of Children: N/A  . Years of Education: N/A   Occupational History  . Not on file.   Social History Main Topics  . Smoking status: Never Smoker   . Smokeless tobacco: Not on file  . Alcohol Use: 1.8 oz/week    3 Glasses of wine per week  . Drug Use: No  . Sexually Active:    Other Topics Concern  . Not on file   Social History Narrative  . No narrative on file    ROS: Please see the HPI.  All other  systems reviewed and negative.  PHYSICAL EXAM:  BP 118/80  Pulse 68  Resp 18  Ht 5\' 4"  (1.626 m)  Wt 176 lb 1.9 oz (79.888 kg)  BMI 30.23 kg/m2  General: Well developed, well nourished, in no acute distress. Head:  Normocephalic and atraumatic. Neck: no JVD Lungs: Clear to auscultation and percussion. Heart: Normal S1 and S2. Prominent click without a definite murmur.   Pulses: Pulses normal in all 4 extremities. Extremities: No clubbing or cyanosis. No edema. Neurologic: Alert and oriented x 3.  EKG:  NSR.  Non specific inferior and anterior ST and T wave changes.  T waves are slightly more prominent than 2011, but asymptomatic and non specific.    ASSESSMENT AND PLAN:

## 2011-08-13 NOTE — Assessment & Plan Note (Signed)
Has prominent click on exam.  No current symptoms.  Will recheck echo in light of abnormal ECG.

## 2011-08-13 NOTE — Assessment & Plan Note (Addendum)
Denies any chest pain. Her ECG compared to before shows subtle ECG changes.  I went back and reviewed all of her previous ECGs, dating back to 2006.  There is some T flattening one year ago, but slightly more now.  Will schedule both routine GXT and also get echo  (for abnormal ECG and also MVP history).

## 2011-08-13 NOTE — Patient Instructions (Signed)
Your physician has requested that you have an echocardiogram. Echocardiography is a painless test that uses sound waves to create images of your heart. It provides your doctor with information about the size and shape of your heart and how well your heart's chambers and valves are working. This procedure takes approximately one hour. There are no restrictions for this procedure.  Your physician wants you to follow-up in: 1 YEAR You will receive a reminder letter in the mail two months in advance. If you don't receive a letter, please call our office to schedule the follow-up appointment.   Your physician recommends that you continue on your current medications as directed. Please refer to the Current Medication list given to you today.  

## 2011-08-13 NOTE — Assessment & Plan Note (Signed)
No recurrent symptoms.

## 2011-08-15 ENCOUNTER — Telehealth: Payer: Self-pay | Admitting: Cardiology

## 2011-08-15 DIAGNOSIS — I059 Rheumatic mitral valve disease, unspecified: Secondary | ICD-10-CM

## 2011-08-15 DIAGNOSIS — R9431 Abnormal electrocardiogram [ECG] [EKG]: Secondary | ICD-10-CM

## 2011-08-15 NOTE — Telephone Encounter (Signed)
Kristin Leonard I spoke with her by phone. She is agreeable for a routine GXT (POET). Please schedule within the next month.  Thanks   TS   I spoke with the pt and she would like to get GXT and Echo scheduled on the same day.  I will have Lela call the pt to schedule these tests. Instruction sheet mailed to the pt for GXT.

## 2011-08-15 NOTE — Telephone Encounter (Signed)
Pt returning call from Dr. Riley Kill on Tuesday night to set up treadmill test. Please return pt call to discuss further.

## 2011-08-21 ENCOUNTER — Other Ambulatory Visit (HOSPITAL_COMMUNITY): Payer: 59 | Admitting: Radiology

## 2011-09-02 ENCOUNTER — Ambulatory Visit (INDEPENDENT_AMBULATORY_CARE_PROVIDER_SITE_OTHER): Payer: 59 | Admitting: Cardiology

## 2011-09-02 ENCOUNTER — Ambulatory Visit (HOSPITAL_COMMUNITY): Payer: 59 | Attending: Cardiology | Admitting: Radiology

## 2011-09-02 DIAGNOSIS — R9431 Abnormal electrocardiogram [ECG] [EKG]: Secondary | ICD-10-CM | POA: Insufficient documentation

## 2011-09-02 DIAGNOSIS — I471 Supraventricular tachycardia, unspecified: Secondary | ICD-10-CM | POA: Insufficient documentation

## 2011-09-02 DIAGNOSIS — J45909 Unspecified asthma, uncomplicated: Secondary | ICD-10-CM | POA: Insufficient documentation

## 2011-09-02 DIAGNOSIS — I341 Nonrheumatic mitral (valve) prolapse: Secondary | ICD-10-CM

## 2011-09-02 DIAGNOSIS — I059 Rheumatic mitral valve disease, unspecified: Secondary | ICD-10-CM | POA: Insufficient documentation

## 2011-09-02 DIAGNOSIS — I209 Angina pectoris, unspecified: Secondary | ICD-10-CM

## 2011-09-02 NOTE — Progress Notes (Signed)
Exercise Treadmill Test  Pre-Exercise Testing Evaluation Rhythm: normal sinus  Rate: 62   PR:  18 QRS:  .09  QT:  .44 QTc: .45     Test  Exercise Tolerance Test Ordering MD: Shawnie Pons, MD  Interpreting MD:  Shawnie Pons, MD  Unique Test No: 1  Treadmill:  1  Indication for ETT: known ASHD  Contraindication to ETT: No   Stress Modality: exercise - treadmill  Cardiac Imaging Performed: non   Protocol: standard Bruce - maximal  Max BP  136/94Max MPHR (bpm):  161 85% MPR (bpm):  136  MPHR obtained (bpm):  136 % MPHR obtained:85  Reached 85% MPHR (min:sec): 8:50 Total Exercise Time (min-sec):  9:00  Workload in METS:  10.1 Borg Scale:15  Reason ETT Terminated:  fatigue    ST Segment Analysis At Rest: non-specific ST segment slurring With Exercise: no evidence of significant ST depression  Other Information Arrhythmia:  No Angina during ETT:  absent (0) Quality of ETT:  diagnostic  ETT Interpretation:  normal - no evidence of ischemia by ST analysis  Comments: She exercised on the Bruce protocol without chest pain.  There was minor J point depression without ST depression.  No angina.  Negative for ischemia.    Recommendations: Review echocardiogram.  No significant changes.

## 2011-09-15 ENCOUNTER — Telehealth: Payer: Self-pay | Admitting: Cardiology

## 2011-09-15 NOTE — Telephone Encounter (Signed)
Left patient a message to call back.

## 2011-09-15 NOTE — Telephone Encounter (Signed)
Pt calling wanting to know results of pt ECHO. Please return call to discuss further.  

## 2011-09-16 MED ORDER — PROPRANOLOL HCL 40 MG PO TABS: 40.0000 mg | ORAL_TABLET | Freq: Three times a day (TID) | ORAL | Status: DC

## 2011-09-16 MED ORDER — NITROGLYCERIN 0.4 MG SL SUBL: 0.4000 mg | SUBLINGUAL_TABLET | SUBLINGUAL | Status: DC | PRN

## 2011-09-16 NOTE — Telephone Encounter (Signed)
I spoke with the pt and made her aware of ECHO results.  The pt also requested refills on NTG and Propanolol. Rx sent to pharmacy.

## 2011-09-16 NOTE — Telephone Encounter (Signed)
Routing to lauren.  °

## 2011-10-02 ENCOUNTER — Other Ambulatory Visit: Payer: Self-pay | Admitting: Internal Medicine

## 2011-10-31 ENCOUNTER — Telehealth: Payer: Self-pay

## 2011-10-31 MED ORDER — LEVOTHYROXINE SODIUM 150 MCG PO TABS: 150.0000 ug | ORAL_TABLET | Freq: Every day | ORAL | Status: DC

## 2011-10-31 NOTE — Telephone Encounter (Signed)
I have not seen anything from the other doctor at this time . I do not see anything that has been scanned into labs or media Dr. Amador Cunas do you recall seeing this lab?

## 2011-10-31 NOTE — Telephone Encounter (Signed)
Pt states that her lab results from Dr. Vickey Sages should have been faxed.  Pt states according to the lab results her TSH levels were out of wack.  Pt states her synthroid needs to be adjusted. Pls advise.

## 2011-10-31 NOTE — Telephone Encounter (Signed)
Report obtained - increase synthroid to and recheck tsh in 6-8wks Attempt to call- both home and cell - VM - LMTCB if questions - increase in thyroid med - [facility] - need to call and schedule lab appt in 6 wks - repeat lab for tsh. KIK

## 2011-10-31 NOTE — Telephone Encounter (Signed)
Please call Dr Renaldo Fiddler office for report

## 2011-11-14 ENCOUNTER — Other Ambulatory Visit: Payer: Self-pay | Admitting: Internal Medicine

## 2012-02-05 ENCOUNTER — Telehealth: Payer: Self-pay | Admitting: Internal Medicine

## 2012-02-05 NOTE — Telephone Encounter (Signed)
Schedule rov 

## 2012-02-05 NOTE — Telephone Encounter (Signed)
PLEASE CALL AND SCHEDULE ROV- LAST SEEN 12/2010

## 2012-02-05 NOTE — Telephone Encounter (Signed)
Patient called stating that she need a tsh as she is on synthoid. Please advise.

## 2012-02-05 NOTE — Telephone Encounter (Signed)
Please advise - last seen 12/2010 There was an outside lab done 10/2011 that we reviewed and you made adjustments to med ,we had asked to repeat lab in 6 wks - that has not been done  Schedule lab?

## 2012-02-09 ENCOUNTER — Ambulatory Visit: Payer: 59 | Admitting: Internal Medicine

## 2012-02-17 ENCOUNTER — Encounter: Payer: Self-pay | Admitting: Internal Medicine

## 2012-02-17 ENCOUNTER — Ambulatory Visit (INDEPENDENT_AMBULATORY_CARE_PROVIDER_SITE_OTHER): Payer: 59 | Admitting: Internal Medicine

## 2012-02-17 VITALS — BP 120/84 | Temp 98.4°F | Wt 173.0 lb

## 2012-02-17 DIAGNOSIS — I209 Angina pectoris, unspecified: Secondary | ICD-10-CM

## 2012-02-17 DIAGNOSIS — J45909 Unspecified asthma, uncomplicated: Secondary | ICD-10-CM

## 2012-02-17 DIAGNOSIS — E039 Hypothyroidism, unspecified: Secondary | ICD-10-CM

## 2012-02-17 MED ORDER — LEVOTHYROXINE SODIUM 150 MCG PO TABS: 150.0000 ug | ORAL_TABLET | Freq: Every day | ORAL | Status: DC

## 2012-02-17 MED ORDER — PROAIR HFA 108 (90 BASE) MCG/ACT IN AERS: 2.0000 | INHALATION_SPRAY | Freq: Four times a day (QID) | RESPIRATORY_TRACT | Status: DC | PRN

## 2012-02-17 MED ORDER — LOTEPREDNOL ETABONATE 0.5 % OP SUSP: 1.0000 [drp] | OPHTHALMIC | Status: AC

## 2012-02-17 MED ORDER — TRAMADOL HCL 50 MG PO TABS: 50.0000 mg | ORAL_TABLET | Freq: Four times a day (QID) | ORAL | Status: DC | PRN

## 2012-02-17 MED ORDER — PROPRANOLOL HCL 40 MG PO TABS: 40.0000 mg | ORAL_TABLET | Freq: Three times a day (TID) | ORAL | Status: DC

## 2012-02-17 MED ORDER — DILTIAZEM HCL ER COATED BEADS 180 MG PO CP24: 180.0000 mg | ORAL_CAPSULE | Freq: Every day | ORAL | Status: DC

## 2012-02-17 MED ORDER — ALPRAZOLAM 0.5 MG PO TABS: 0.5000 mg | ORAL_TABLET | Freq: Every evening | ORAL | Status: DC | PRN

## 2012-02-17 NOTE — Patient Instructions (Signed)
It is important that you exercise regularly, at least 20 minutes 3 to 4 times per week.  If you develop chest pain or shortness of breath seek  medical attention.  Return in 6 months for follow-up  

## 2012-02-17 NOTE — Progress Notes (Signed)
  Subjective:    Patient ID: Kristin Leonard, female    DOB: 1951-02-04, 61 y.o.   MRN: [id number]  HPI  Wt Readings from Last 3 Encounters:  02/17/12 173 lb (78.472 kg)  08/13/11 176 lb 1.9 oz (79.888 kg)  12/04/10 166 lb (75.297 kg)    Review of Systems     Objective:   Physical Exam        Assessment & Plan:

## 2012-02-17 NOTE — Progress Notes (Signed)
  Subjective:    Patient ID: Kristin Leonard, female    DOB: September 20, 1951, 61 y.o.   MRN: [id number]  HPI  61 year old patient who is status post I-131 ablation in her mid 30s. She has been on Synthroid supplementation and did require an up titration a few months ago. Presently she is on 150 mcg daily and feels well there's been some modest weight loss. She has been diagnosed as having dry eyes otherwise her status has been fairly stable. She has a history of coronary artery vasospasm and does take propranolol. She has asthma which has been stable.    Review of Systems  Constitutional: Negative.   HENT: Negative for hearing loss, congestion, sore throat, rhinorrhea, dental problem, sinus pressure and tinnitus.   Eyes: Negative for pain, discharge and visual disturbance.  Respiratory: Negative for cough and shortness of breath.   Cardiovascular: Negative for chest pain, palpitations and leg swelling.  Gastrointestinal: Negative for nausea, vomiting, abdominal pain, diarrhea, constipation, blood in stool and abdominal distention.  Genitourinary: Negative for dysuria, urgency, frequency, hematuria, flank pain, vaginal bleeding, vaginal discharge, difficulty urinating, vaginal pain and pelvic pain.  Musculoskeletal: Negative for joint swelling, arthralgias and gait problem.  Skin: Negative for rash.  Neurological: Negative for dizziness, syncope, speech difficulty, weakness, numbness and headaches.  Hematological: Negative for adenopathy.  Psychiatric/Behavioral: Negative for behavioral problems, dysphoric mood and agitation. The patient is not nervous/anxious.        Objective:   Physical Exam  Constitutional: She is oriented to person, place, and time. She appears well-developed and well-nourished.  HENT:  Head: Normocephalic.  Right Ear: External ear normal.  Left Ear: External ear normal.  Mouth/Throat: Oropharynx is clear and moist.  Eyes: Conjunctivae and EOM are normal. Pupils are  equal, round, and reactive to light.  Neck: Normal range of motion. Neck supple. No thyromegaly present.  Cardiovascular: Normal rate, regular rhythm, normal heart sounds and intact distal pulses.   Pulmonary/Chest: Effort normal and breath sounds normal.  Abdominal: Soft. Bowel sounds are normal. She exhibits no mass. There is no tenderness.  Musculoskeletal: Normal range of motion.  Lymphadenopathy:    She has no cervical adenopathy.  Neurological: She is alert and oriented to person, place, and time.  Skin: Skin is warm and dry. No rash noted.  Psychiatric: She has a normal mood and affect. Her behavior is normal.          Assessment & Plan:   Hypothyroidism Asthma stable History of PSVT  Medications refilled Recheck TSH  Recheck 6 months

## 2012-02-18 ENCOUNTER — Encounter: Payer: Self-pay | Admitting: Internal Medicine

## 2012-02-18 NOTE — Progress Notes (Signed)
Quick Note:  Spoke with pt- informed of lab and confirmed that she is currently on since dec. When change was made - transferred to scheduling for appt next week to discuss ______

## 2012-02-18 NOTE — Progress Notes (Signed)
Quick Note:  Spoke with pt- informed to start in 4 days - rov 6wks - will recheck tsh then ______

## 2012-02-24 ENCOUNTER — Ambulatory Visit: Payer: 59 | Admitting: Internal Medicine

## 2012-04-21 ENCOUNTER — Other Ambulatory Visit: Payer: Self-pay | Admitting: Internal Medicine

## 2012-04-21 DIAGNOSIS — E079 Disorder of thyroid, unspecified: Secondary | ICD-10-CM

## 2012-04-22 ENCOUNTER — Other Ambulatory Visit (INDEPENDENT_AMBULATORY_CARE_PROVIDER_SITE_OTHER): Payer: 59

## 2012-04-22 DIAGNOSIS — E079 Disorder of thyroid, unspecified: Secondary | ICD-10-CM

## 2012-04-22 NOTE — Progress Notes (Signed)
Quick Note:  Attempt to call cell# - Vm - LMTCB if questions - lab normal - keep rov and contiune meds currently taking ______

## 2012-04-23 ENCOUNTER — Other Ambulatory Visit: Payer: Self-pay

## 2012-04-23 MED ORDER — LEVOTHYROXINE SODIUM 100 MCG PO TABS: 100.0000 ug | ORAL_TABLET | Freq: Every day | ORAL | Status: DC

## 2012-09-01 ENCOUNTER — Encounter: Payer: Self-pay | Admitting: Internal Medicine

## 2012-09-01 ENCOUNTER — Ambulatory Visit (INDEPENDENT_AMBULATORY_CARE_PROVIDER_SITE_OTHER): Payer: 59 | Admitting: Internal Medicine

## 2012-09-01 VITALS — BP 120/76 | Temp 97.8°F | Wt 172.0 lb

## 2012-09-01 DIAGNOSIS — J45909 Unspecified asthma, uncomplicated: Secondary | ICD-10-CM

## 2012-09-01 DIAGNOSIS — Z23 Encounter for immunization: Secondary | ICD-10-CM

## 2012-09-01 MED ORDER — HYDROCODONE-HOMATROPINE 5-1.5 MG/5ML PO SYRP: 5.0000 mL | ORAL_SOLUTION | Freq: Four times a day (QID) | ORAL | Status: AC | PRN

## 2012-09-01 MED ORDER — ALPRAZOLAM 0.5 MG PO TABS: 0.5000 mg | ORAL_TABLET | Freq: Every evening | ORAL | Status: DC | PRN

## 2012-09-01 MED ORDER — TRAMADOL HCL 50 MG PO TABS: 50.0000 mg | ORAL_TABLET | Freq: Four times a day (QID) | ORAL | Status: DC | PRN

## 2012-09-01 MED ORDER — PROPRANOLOL HCL 40 MG PO TABS: 40.0000 mg | ORAL_TABLET | Freq: Three times a day (TID) | ORAL | Status: DC

## 2012-09-01 MED ORDER — AZITHROMYCIN 250 MG PO TABS: ORAL_TABLET | ORAL | Status: DC

## 2012-09-01 MED ORDER — LEVOTHYROXINE SODIUM 100 MCG PO TABS: 100.0000 ug | ORAL_TABLET | Freq: Every day | ORAL | Status: DC

## 2012-09-01 MED ORDER — DILTIAZEM HCL ER COATED BEADS 180 MG PO CP24: 180.0000 mg | ORAL_CAPSULE | Freq: Every day | ORAL | Status: DC

## 2012-09-01 MED ORDER — PROAIR HFA 108 (90 BASE) MCG/ACT IN AERS: 2.0000 | INHALATION_SPRAY | Freq: Four times a day (QID) | RESPIRATORY_TRACT | Status: DC | PRN

## 2012-09-01 NOTE — Patient Instructions (Addendum)
Take over-the-counter expectorants and cough medications such as  Mucinex DM.  Call if there is no improvement in 5 to 7 days or if he developed worsening cough, fever, or new symptoms, such as shortness of breath or chest pain.  NASONEX  use once daily

## 2012-09-01 NOTE — Progress Notes (Signed)
Subjective:    Patient ID: Kristin Leonard, female    DOB: 06/12/1951, 61 y.o.   MRN: [id number]  HPI  61 year old patient who has a history of coronary artery disease and asthma. She presents with a nine-day history of head and chest congestion and cough she's had some intermittent wheezing requiring some rescue medication. No fever cough is largely nonproductive but occasionally small volume yellow sputum.  Past Medical History  Diagnosis Date  . HYPOTHYROIDISM 02/07/2010  . CORONARY ARTERY SPASM 04/26/2007  . ASTHMA 04/26/2007  . NECK PAIN 02/01/2008  . SUPRAVENTRICULAR TACHYCARDIA, HX OF 04/26/2007    History   Social History  . Marital Status: Married    Spouse Name: N/A    Number of Children: N/A  . Years of Education: N/A   Occupational History  . Not on file.   Social History Main Topics  . Smoking status: Never Smoker   . Smokeless tobacco: Not on file  . Alcohol Use: 1.8 oz/week    3 Glasses of wine per week  . Drug Use: No  . Sexually Active:    Other Topics Concern  . Not on file   Social History Narrative  . No narrative on file    Past Surgical History  Procedure Date  . Tubal ligation   . Breast surgery     biopsy  . Laser ablation     x2    No family history on file.  No Known Allergies  Current Outpatient Prescriptions on File Prior to Visit  Medication Sig Dispense Refill  . nitroGLYCERIN (NITROSTAT) 0.4 MG SL tablet Place 1 tablet (0.4 mg total) under the tongue every 5 (five) minutes as needed. Not to exceed 3 doses  25 tablet  2  . DISCONTD: diltiazem (CARDIZEM CD) 180 MG 24 hr capsule Take 1 capsule (180 mg total) by mouth daily.  90 capsule  4  . DISCONTD: PROAIR HFA 108 (90 BASE) MCG/ACT inhaler Inhale 2 puffs into the lungs every 6 (six) hours as needed for wheezing.  3 Inhaler  1  . DISCONTD: propranolol (INDERAL) 40 MG tablet Take 1 tablet (40 mg total) by mouth 3 (three) times daily. As needed  270 tablet  3    BP 120/76  Temp  97.8 F (36.6 C) (Oral)  Wt 172 lb (78.019 kg)     Review of Systems  Constitutional: Positive for fatigue.  HENT: Positive for congestion. Negative for hearing loss, sore throat, rhinorrhea, dental problem, sinus pressure and tinnitus.   Eyes: Negative for pain, discharge and visual disturbance.  Respiratory: Positive for cough. Negative for shortness of breath.   Cardiovascular: Negative for chest pain, palpitations and leg swelling.  Gastrointestinal: Negative for nausea, vomiting, abdominal pain, diarrhea, constipation, blood in stool and abdominal distention.  Genitourinary: Negative for dysuria, urgency, frequency, hematuria, flank pain, vaginal bleeding, vaginal discharge, difficulty urinating, vaginal pain and pelvic pain.  Musculoskeletal: Negative for joint swelling, arthralgias and gait problem.  Skin: Negative for rash.  Neurological: Negative for dizziness, syncope, speech difficulty, weakness, numbness and headaches.  Hematological: Negative for adenopathy.  Psychiatric/Behavioral: Negative for behavioral problems, dysphoric mood and agitation. The patient is not nervous/anxious.        Objective:   Physical Exam  Constitutional: She is oriented to person, place, and time. She appears well-developed and well-nourished.  HENT:  Head: Normocephalic.  Right Ear: External ear normal.  Left Ear: External ear normal.       Mild erythema of the  oropharynx  Eyes: Conjunctivae normal and EOM are normal. Pupils are equal, round, and reactive to light.  Neck: Normal range of motion. Neck supple. No thyromegaly present.  Cardiovascular: Normal rate, regular rhythm, normal heart sounds and intact distal pulses.   Pulmonary/Chest: Effort normal and breath sounds normal.  Abdominal: Soft. Bowel sounds are normal. She exhibits no mass. There is no tenderness.  Musculoskeletal: Normal range of motion.  Lymphadenopathy:    She has no cervical adenopathy.  Neurological: She is alert  and oriented to person, place, and time.  Skin: Skin is warm and dry. No rash noted.  Psychiatric: She has a normal mood and affect. Her behavior is normal.          Assessment & Plan:   Asthmatic bronchitis Coronary artery disease  We'll treat with expectorants and azithromycin. We'll continue rescue medicine as needed. We'll force fluids. Will call if unimproved

## 2012-09-14 ENCOUNTER — Ambulatory Visit: Payer: 59 | Admitting: Cardiology

## 2012-09-17 ENCOUNTER — Telehealth: Payer: Self-pay | Admitting: Internal Medicine

## 2012-09-17 MED ORDER — PREDNISONE 20 MG PO TABS: 20.0000 mg | ORAL_TABLET | Freq: Two times a day (BID) | ORAL | Status: DC

## 2012-09-17 NOTE — Telephone Encounter (Signed)
Med filled.  

## 2012-09-17 NOTE — Telephone Encounter (Signed)
Caller: [name]/Patient; Patient Name: Kristin Leonard; PCP: Eleonore Chiquito [facility]); Best Callback Phone Number: [phone] Pt was into the office on 09/01/12 and dx with an sinus infection. Pt took and finished the z-pac. She is still using the Hydrocodone syrup (she uses prn only). Pt is still using Nasonex daily and has started Zyrtec. Pt still has sinus pain/congestion/sore throat. No fever. Pt is asking if MD would call in a stronger antibiotic. Symptoms have not improved. Pt uses [facility]. OFFICE PLEASE CALL PT BACK.

## 2012-09-17 NOTE — Telephone Encounter (Signed)
Prednisone 20 mg #14 one twice a day 

## 2012-09-24 ENCOUNTER — Other Ambulatory Visit: Payer: Self-pay | Admitting: Internal Medicine

## 2012-09-24 ENCOUNTER — Other Ambulatory Visit: Payer: Self-pay | Admitting: *Deleted

## 2012-09-24 MED ORDER — FLUTICASONE-SALMETEROL 250-50 MCG/DOSE IN AEPB: 2.0000 | INHALATION_SPRAY | Freq: Two times a day (BID) | RESPIRATORY_TRACT | Status: DC

## 2012-09-27 ENCOUNTER — Other Ambulatory Visit: Payer: Self-pay | Admitting: *Deleted

## 2012-09-27 ENCOUNTER — Telehealth: Payer: Self-pay | Admitting: Internal Medicine

## 2012-09-27 MED ORDER — FLUTICASONE-SALMETEROL 250-50 MCG/DOSE IN AEPB: 1.0000 | INHALATION_SPRAY | Freq: Two times a day (BID) | RESPIRATORY_TRACT | Status: DC

## 2012-09-27 MED ORDER — ALPRAZOLAM 0.5 MG PO TABS: 0.5000 mg | ORAL_TABLET | Freq: Every evening | ORAL | Status: DC | PRN

## 2012-09-27 NOTE — Telephone Encounter (Signed)
Pharmacist needs clarification on Fluticasone-Salmeterol (ADVAIR DISKUS) 250-50 MCG/DOSE AEPB instructions. Pls call.

## 2012-09-27 NOTE — Telephone Encounter (Signed)
Called pharmacy and spoke to [place] told her was as directed originally on Rx. Aundra Millet said pt said she uses inhaler one puff twice a day. Told pt Aundra Millet that is fine will change the Rx.

## 2013-03-21 ENCOUNTER — Other Ambulatory Visit: Payer: Self-pay | Admitting: Internal Medicine

## 2013-04-15 ENCOUNTER — Encounter: Payer: Self-pay | Admitting: Internal Medicine

## 2013-04-15 ENCOUNTER — Ambulatory Visit (INDEPENDENT_AMBULATORY_CARE_PROVIDER_SITE_OTHER): Payer: 59 | Admitting: Internal Medicine

## 2013-04-15 ENCOUNTER — Ambulatory Visit: Payer: 59 | Admitting: Family Medicine

## 2013-04-15 VITALS — BP 114/80 | HR 72 | Temp 98.1°F | Resp 20 | Wt 171.0 lb

## 2013-04-15 DIAGNOSIS — J45909 Unspecified asthma, uncomplicated: Secondary | ICD-10-CM

## 2013-04-15 DIAGNOSIS — J029 Acute pharyngitis, unspecified: Secondary | ICD-10-CM

## 2013-04-15 DIAGNOSIS — E039 Hypothyroidism, unspecified: Secondary | ICD-10-CM

## 2013-04-15 MED ORDER — ALPRAZOLAM 0.5 MG PO TABS: 0.5000 mg | ORAL_TABLET | Freq: Every evening | ORAL | Status: DC | PRN

## 2013-04-15 MED ORDER — HYDROCODONE-HOMATROPINE 5-1.5 MG/5ML PO SYRP: 5.0000 mL | ORAL_SOLUTION | Freq: Four times a day (QID) | ORAL | Status: AC | PRN

## 2013-04-15 NOTE — Progress Notes (Signed)
Subjective:    Patient ID: Kristin Leonard, female    DOB: 07-Sep-1951, 62 y.o.   MRN: [id number]  HPI  62 year old patient has a history of asthma as well as treated hypothyroidism. She was at the beach one week ago and became the ill with sore throat cough and hoarseness. She was treated for strep pharyngitis with Bicillin on Monday, June 9 at 3 positive rapid strep test. No fever but she continues to have cough hoarseness and mainly general sense of unwellness. Her asthma has been stable. No active wheezing  Past Medical History  Diagnosis Date  . HYPOTHYROIDISM 02/07/2010  . CORONARY ARTERY SPASM 04/26/2007  . ASTHMA 04/26/2007  . NECK PAIN 02/01/2008  . SUPRAVENTRICULAR TACHYCARDIA, HX OF 04/26/2007    History   Social History  . Marital Status: Married    Spouse Name: N/A    Number of Children: N/A  . Years of Education: N/A   Occupational History  . Not on file.   Social History Main Topics  . Smoking status: Never Smoker   . Smokeless tobacco: Not on file  . Alcohol Use: 1.8 oz/week    3 Glasses of wine per week  . Drug Use: No  . Sexually Active:    Other Topics Concern  . Not on file   Social History Narrative  . No narrative on file    Past Surgical History  Procedure Laterality Date  . Tubal ligation    . Breast surgery      biopsy  . Laser ablation      x2    History reviewed. No pertinent family history.  No Known Allergies  Current Outpatient Prescriptions on File Prior to Visit  Medication Sig Dispense Refill  . ALPRAZolam (XANAX) 0.5 MG tablet Take 1 tablet (0.5 mg total) by mouth at bedtime as needed for sleep.  60 tablet  2  . diltiazem (CARDIZEM CD) 180 MG 24 hr capsule Take 1 capsule (180 mg total) by mouth daily.  90 capsule  4  . Fluticasone-Salmeterol (ADVAIR DISKUS) 250-50 MCG/DOSE AEPB Inhale 1 puff into the lungs 2 (two) times daily.  180 each  3  . levothyroxine (SYNTHROID, LEVOTHROID) 100 MCG tablet Take 1 tablet (100 mcg total) by  mouth daily.  90 tablet  1  . nitroGLYCERIN (NITROSTAT) 0.4 MG SL tablet Place 1 tablet (0.4 mg total) under the tongue every 5 (five) minutes as needed. Not to exceed 3 doses  25 tablet  2  . PROAIR HFA 108 (90 BASE) MCG/ACT inhaler INHALE 2 PUFFS BY MOUTH INTO LUNGS EVERY 6 HOURS AS NEEDED FOR WHEEZING  25.5 g  PRN  . propranolol (INDERAL) 40 MG tablet Take 1 tablet (40 mg total) by mouth 3 (three) times daily. As needed  270 tablet  3  . traMADol (ULTRAM) 50 MG tablet Take 1 tablet (50 mg total) by mouth every 6 (six) hours as needed.  30 tablet  2   No current facility-administered medications on file prior to visit.    BP 114/80  Pulse 72  Temp(Src) 98.1 F (36.7 C) (Oral)  Resp 20  Wt 171 lb (77.565 kg)  BMI 29.34 kg/m2  SpO2 98%       Review of Systems  Constitutional: Positive for chills and fatigue.  HENT: Positive for congestion and sore throat. Negative for hearing loss, rhinorrhea, dental problem, sinus pressure and tinnitus.   Eyes: Negative for pain, discharge and visual disturbance.  Respiratory: Positive for cough. Negative  for shortness of breath.   Cardiovascular: Negative for chest pain, palpitations and leg swelling.  Gastrointestinal: Negative for nausea, vomiting, abdominal pain, diarrhea, constipation, blood in stool and abdominal distention.  Genitourinary: Negative for dysuria, urgency, frequency, hematuria, flank pain, vaginal bleeding, vaginal discharge, difficulty urinating, vaginal pain and pelvic pain.  Musculoskeletal: Negative for joint swelling, arthralgias and gait problem.  Skin: Negative for rash.  Neurological: Negative for dizziness, syncope, speech difficulty, weakness, numbness and headaches.  Hematological: Negative for adenopathy.  Psychiatric/Behavioral: Negative for behavioral problems, dysphoric mood and agitation. The patient is not nervous/anxious.        Objective:   Physical Exam  Constitutional: She is oriented to person,  place, and time. She appears well-developed and well-nourished.  HENT:  Head: Normocephalic.  Right Ear: External ear normal.  Left Ear: External ear normal.  Mouth/Throat: Oropharynx is clear and moist.   mild erythema  Eyes: Conjunctivae and EOM are normal. Pupils are equal, round, and reactive to light.  Neck: Normal range of motion. Neck supple. No thyromegaly present.  Cardiovascular: Normal rate, regular rhythm, normal heart sounds and intact distal pulses.   Pulmonary/Chest: Effort normal and breath sounds normal. No respiratory distress. She has no wheezes. She has no rales.  Abdominal: Soft. Bowel sounds are normal. She exhibits no mass. There is no tenderness.  Musculoskeletal: Normal range of motion.  Lymphadenopathy:    She has no cervical adenopathy.  Neurological: She is alert and oriented to person, place, and time.  Skin: Skin is warm and dry. No rash noted.  Psychiatric: She has a normal mood and affect. Her behavior is normal.          Assessment & Plan:  URI with pharyngitis. We'll continue symptomatic treatment. The patient was treated with Bicillin for apparent strep pharyngitis Asthma stable

## 2013-04-15 NOTE — Patient Instructions (Signed)
Take over-the-counter expectorants and cough medications such as  Mucinex DM.  Call if there is no improvement in 5 to 7 days or if he developed worsening cough, fever, or new symptoms, such as shortness of breath or chest pain. 

## 2013-05-17 ENCOUNTER — Other Ambulatory Visit: Payer: Self-pay | Admitting: *Deleted

## 2013-05-17 MED ORDER — LEVOTHYROXINE SODIUM 100 MCG PO TABS: 100.0000 ug | ORAL_TABLET | Freq: Every day | ORAL | Status: DC

## 2013-09-14 ENCOUNTER — Other Ambulatory Visit: Payer: Self-pay | Admitting: Internal Medicine

## 2013-12-05 ENCOUNTER — Ambulatory Visit (INDEPENDENT_AMBULATORY_CARE_PROVIDER_SITE_OTHER): Payer: 59 | Admitting: Family

## 2013-12-05 ENCOUNTER — Encounter: Payer: Self-pay | Admitting: Family

## 2013-12-05 VITALS — BP 118/82 | HR 71 | Temp 97.9°F | Wt 180.0 lb

## 2013-12-05 DIAGNOSIS — J322 Chronic ethmoidal sinusitis: Secondary | ICD-10-CM

## 2013-12-05 DIAGNOSIS — J309 Allergic rhinitis, unspecified: Secondary | ICD-10-CM

## 2013-12-05 MED ORDER — METHYLPREDNISOLONE 4 MG PO KIT: PACK | ORAL | Status: AC

## 2013-12-05 NOTE — Progress Notes (Signed)
Subjective:    Patient ID: Kristin Leonard, female    DOB: 1951-03-17, 63 y.o.   MRN: [id number]  HPI 63 y.o. Female who presents today with chief complaint of "sinus congestion". Pt states that 5 days ago she began having a "scratchy throat", and since then it has progressed to a congested, runny nose and a cough at night. Her nasal drainage is clear and thin, her cough is non productive. Pt acknowledges slight fatigue but denies fever and weight change.     Review of Systems  Constitutional: Positive for chills and fatigue.  HENT: Positive for congestion, rhinorrhea, sinus pressure and sore throat.   Eyes: Negative.   Respiratory: Positive for chest tightness.        At times   Cardiovascular: Negative.   Gastrointestinal: Negative.   Endocrine: Negative.   Genitourinary: Negative.   Musculoskeletal: Negative.   Skin: Negative.   Allergic/Immunologic: Negative.   Neurological: Negative.   Hematological: Negative.   Psychiatric/Behavioral: Negative.        Past Medical History  Diagnosis Date  . HYPOTHYROIDISM 02/07/2010  . CORONARY ARTERY SPASM 04/26/2007  . ASTHMA 04/26/2007  . NECK PAIN 02/01/2008  . SUPRAVENTRICULAR TACHYCARDIA, HX OF 04/26/2007    History   Social History  . Marital Status: Married    Spouse Name: N/A    Number of Children: N/A  . Years of Education: N/A   Occupational History  . Not on file.   Social History Main Topics  . Smoking status: Never Smoker   . Smokeless tobacco: Not on file  . Alcohol Use: 1.8 oz/week    3 Glasses of wine per week  . Drug Use: No  . Sexual Activity:    Other Topics Concern  . Not on file   Social History Narrative  . No narrative on file    Past Surgical History  Procedure Laterality Date  . Tubal ligation    . Breast surgery      biopsy  . Laser ablation      x2    No family history on file.  No Known Allergies  Current Outpatient Prescriptions on File Prior to Visit  Medication Sig  Dispense Refill  . ALPRAZolam (XANAX) 0.5 MG tablet Take 1 tablet (0.5 mg total) by mouth at bedtime as needed for sleep.  60 tablet  2  . diltiazem (CARDIZEM CD) 180 MG 24 hr capsule TAKE 1 CAPSULE BY MOUTH DAILY.  90 capsule  0  . levothyroxine (SYNTHROID, LEVOTHROID) 100 MCG tablet Take 1 tablet (100 mcg total) by mouth daily.  90 tablet  1  . nitroGLYCERIN (NITROSTAT) 0.4 MG SL tablet Place 1 tablet (0.4 mg total) under the tongue every 5 (five) minutes as needed. Not to exceed 3 doses  25 tablet  2  . PROAIR HFA 108 (90 BASE) MCG/ACT inhaler INHALE 2 PUFFS BY MOUTH INTO LUNGS EVERY 6 HOURS AS NEEDED FOR WHEEZING  25.5 g  PRN  . propranolol (INDERAL) 40 MG tablet Take 1 tablet (40 mg total) by mouth 3 (three) times daily. As needed  270 tablet  3  . Fluticasone-Salmeterol (ADVAIR DISKUS) 250-50 MCG/DOSE AEPB Inhale 1 puff into the lungs 2 (two) times daily.  180 each  3  . traMADol (ULTRAM) 50 MG tablet Take 1 tablet (50 mg total) by mouth every 6 (six) hours as needed.  30 tablet  2   No current facility-administered medications on file prior to visit.    BP  118/82  Pulse 71  Temp(Src) 97.9 F (36.6 C) (Oral)  Wt 180 lb (81.647 kg)chart Objective:   Physical Exam  Constitutional: She is oriented to person, place, and time. Vital signs are normal. She appears well-developed and well-nourished. She is active.  HENT:  Head: Normocephalic.  Right Ear: Tympanic membrane, external ear and ear canal normal.  Left Ear: Tympanic membrane, external ear and ear canal normal.  Nose: Rhinorrhea present.  Mouth/Throat: Uvula is midline, oropharynx is clear and moist and mucous membranes are normal.  Cardiovascular: Normal rate, regular rhythm and normal heart sounds.   Pulmonary/Chest: Effort normal and breath sounds normal.  Abdominal: Soft. Normal appearance and bowel sounds are normal.  Lymphadenopathy:     Neurological: She is alert and oriented to person, place, and time.  Skin: Skin is  warm, dry and intact.  Psychiatric: She has a normal mood and affect. Her speech is normal and behavior is normal.          Assessment & Plan:  63 y.o. White female presents today with chief complaint of "sinus congestion".  - Viral Sinusitis: Start Medrol 4mg  pack p.o. To reduce inflammation.  - Education: Use of saline in nose to help with congestion.  - Follow up as needed or if develops fever.

## 2013-12-05 NOTE — Progress Notes (Signed)
Pre visit review using our clinic review tool, if applicable. No additional management support is needed unless otherwise documented below in the visit note. 

## 2013-12-05 NOTE — Patient Instructions (Signed)
Viral Sinusitis Viral Sinusitis is redness, soreness, and swelling (inflammation) of the paranasal sinuses. Paranasal sinuses are air pockets within the bones of your face (beneath the eyes, the middle of the forehead, or above the eyes). In healthy paranasal sinuses, mucus is able to drain out, and air is able to circulate through them by way of your nose. However, when your paranasal sinuses are inflamed, mucus and air can become trapped. This can allow bacteria and other germs to grow and cause infection. Sinusitis can develop quickly and last only a short time (acute) or continue over a long period (chronic). Sinusitis that lasts for more than 12 weeks is considered chronic.  CAUSES  Causes of sinusitis include:  Allergies.  Structural abnormalities, such as displacement of the cartilage that separates your nostrils (deviated septum), which can decrease the air flow through your nose and sinuses and affect sinus drainage.  Functional abnormalities, such as when the small hairs (cilia) that line your sinuses and help remove mucus do not work properly or are not present. SYMPTOMS  Symptoms of acute and chronic sinusitis are the same. The primary symptoms are pain and pressure around the affected sinuses. Other symptoms include:  Upper toothache.  Earache.  Headache.  Bad breath.  Decreased sense of smell and taste.  A cough, which worsens when you are lying flat.  Fatigue.  Fever.  Thick drainage from your nose, which often is green and may contain pus (purulent).  Swelling and warmth over the affected sinuses. DIAGNOSIS  Your caregiver will perform a physical exam. During the exam, your caregiver may:  Look in your nose for signs of abnormal growths in your nostrils (nasal polyps).  Tap over the affected sinus to check for signs of infection.  View the inside of your sinuses (endoscopy) with a special imaging device with a light attached (endoscope), which is inserted into  your sinuses. If your caregiver suspects that you have chronic sinusitis, one or more of the following tests may be recommended:  Allergy tests.  Nasal culture A sample of mucus is taken from your nose and sent to a lab and screened for bacteria.  Nasal cytology A sample of mucus is taken from your nose and examined by your caregiver to determine if your sinusitis is related to an allergy. TREATMENT  Most cases of acute sinusitis are related to a viral infection and will resolve on their own within 10 days. Sometimes medicines are prescribed to help relieve symptoms (pain medicine, decongestants, nasal steroid sprays, or saline sprays).  However, for sinusitis related to a bacterial infection, your caregiver will prescribe antibiotic medicines. These are medicines that will help kill the bacteria causing the infection.  Rarely, sinusitis is caused by a fungal infection. In theses cases, your caregiver will prescribe antifungal medicine. For some cases of chronic sinusitis, surgery is needed. Generally, these are cases in which sinusitis recurs more than 3 times per year, despite other treatments. HOME CARE INSTRUCTIONS   Drink plenty of water. Water helps thin the mucus so your sinuses can drain more easily.  Use a humidifier.  Inhale steam 3 to 4 times a day (for example, sit in the bathroom with the shower running).  Apply a warm, moist washcloth to your face 3 to 4 times a day, or as directed by your caregiver.  Use saline nasal sprays to help moisten and clean your sinuses.  Take over-the-counter or prescription medicines for pain, discomfort, or fever only as directed by your caregiver. SEEK  IMMEDIATE MEDICAL CARE IF:  You have increasing pain or severe headaches.  You have nausea, vomiting, or drowsiness.  You have swelling around your face.  You have vision problems.  You have a stiff neck.  You have difficulty breathing. MAKE SURE YOU:   Understand these  instructions.  Will watch your condition.  Will get help right away if you are not doing well or get worse. Document Released: 10/20/2005 Document Revised: 01/12/2012 Document Reviewed: 11/04/2011 [facility] Patient Information 2014 [place], [place].

## 2013-12-30 ENCOUNTER — Other Ambulatory Visit: Payer: Self-pay | Admitting: Internal Medicine

## 2014-02-15 ENCOUNTER — Other Ambulatory Visit: Payer: Self-pay | Admitting: Internal Medicine

## 2014-10-03 LAB — HM MAMMOGRAPHY

## 2014-10-12 LAB — TSH: TSH: 18.18 u[IU]/mL — AB (ref 0.41–5.90)

## 2014-10-12 LAB — LIPID PANEL
CHOLESTEROL: 210 mg/dL — AB (ref 0–200)
HDL: 48 mg/dL (ref 35–70)
LDL Cholesterol: 108 mg/dL
LDl/HDL Ratio: 4.4
Triglycerides: 271 mg/dL — AB (ref 40–160)

## 2014-10-12 LAB — HEMOGLOBIN A1C: HEMOGLOBIN A1C: 5.3 % (ref 4.0–6.0)

## 2014-10-17 ENCOUNTER — Telehealth: Payer: Self-pay | Admitting: Internal Medicine

## 2014-10-17 NOTE — Telephone Encounter (Addendum)
Pt went to gyn office and tsh was elevated. Pt will bring results. Pt has an appt sch for 10/30/14. Pt does not want to wait until md to return to adjust her synthroid med

## 2014-10-17 NOTE — Telephone Encounter (Signed)
Blood work result given to [employer]

## 2014-10-18 MED ORDER — SYNTHROID 150 MCG PO TABS: 150.0000 ug | ORAL_TABLET | Freq: Every day | ORAL | Status: DC

## 2014-10-18 MED ORDER — PROAIR HFA 108 (90 BASE) MCG/ACT IN AERS: INHALATION_SPRAY | RESPIRATORY_TRACT | Status: DC

## 2014-10-18 MED ORDER — PROPRANOLOL HCL 40 MG PO TABS: 40.0000 mg | ORAL_TABLET | Freq: Three times a day (TID) | ORAL | Status: DC

## 2014-10-18 MED ORDER — LEVOTHYROXINE SODIUM 150 MCG PO TABS: 150.0000 ug | ORAL_TABLET | Freq: Every day | ORAL | Status: DC

## 2014-10-18 MED ORDER — DILTIAZEM HCL ER COATED BEADS 180 MG PO CP24: 180.0000 mg | ORAL_CAPSULE | Freq: Every day | ORAL | Status: DC

## 2014-10-18 MED ORDER — ALPRAZOLAM 0.5 MG PO TABS: 0.5000 mg | ORAL_TABLET | Freq: Every evening | ORAL | Status: DC | PRN

## 2014-10-18 NOTE — Telephone Encounter (Signed)
Spoke to pt, told her Dr. Sherren Mocha reviewed her labs and wanted me to make sure you are taking present dose correctly. Are you taking it in the morning with water and no food and waiting 30 minutes to eat. Pt said yes. Told her okay then Dr. Sherren Mocha said he will change dose to 150 mcg daily and recheck thyroid in 6 weeks. Pt verbalized understanding and stated she needed refills also on other medications. Told pt will send refills to pharmacy. Pt verbalized understanding.

## 2014-10-24 ENCOUNTER — Encounter: Payer: Self-pay | Admitting: Internal Medicine

## 2014-10-30 ENCOUNTER — Encounter: Payer: Self-pay | Admitting: Internal Medicine

## 2014-10-30 ENCOUNTER — Encounter: Payer: Self-pay | Admitting: *Deleted

## 2014-10-30 ENCOUNTER — Ambulatory Visit (INDEPENDENT_AMBULATORY_CARE_PROVIDER_SITE_OTHER): Payer: 59 | Admitting: Internal Medicine

## 2014-10-30 VITALS — BP 122/86 | HR 74 | Temp 98.0°F | Resp 20 | Ht 64.0 in | Wt 190.0 lb

## 2014-10-30 DIAGNOSIS — J4540 Moderate persistent asthma, uncomplicated: Secondary | ICD-10-CM

## 2014-10-30 DIAGNOSIS — Z23 Encounter for immunization: Secondary | ICD-10-CM

## 2014-10-30 DIAGNOSIS — E039 Hypothyroidism, unspecified: Secondary | ICD-10-CM

## 2014-10-30 DIAGNOSIS — I471 Supraventricular tachycardia: Secondary | ICD-10-CM

## 2014-10-30 MED ORDER — ALPRAZOLAM 0.5 MG PO TABS: 0.5000 mg | ORAL_TABLET | Freq: Two times a day (BID) | ORAL | Status: DC | PRN

## 2014-10-30 NOTE — Progress Notes (Signed)
Subjective:    Patient ID: Kristin Leonard, female    DOB: 07/31/1951, 63 y.o.   MRN: [id number]  HPI  63 year old patient who is seen today in follow-up.  She has had a recent gynecologic evaluation that included thyroid function studies.  TSH was elevated.  For the past 10 days she has had an increased dose of 150 g daily.  Prior dose was 100 g daily.  She generally feels well.  Free T4 was slightly low.  She has a prior history of Graves' disease and is status post I-131.  She has been very compliant with her medication.  This has been her steady dose for a number of years.  Repeat TSH was persistently elevated. She has a history of PSVT and has had an ablation in the past.  She continues to do well, although does have recent self-limiting episodes of tachycardia that occurs about once per week.  She takes occasional extra propranolol.  She is on a maintenance dose of diltiazem.  Past Medical History  Diagnosis Date  . HYPOTHYROIDISM 02/07/2010  . CORONARY ARTERY SPASM 04/26/2007  . ASTHMA 04/26/2007  . NECK PAIN 02/01/2008  . SUPRAVENTRICULAR TACHYCARDIA, HX OF 04/26/2007    History   Social History  . Marital Status: Married    Spouse Name: N/A    Number of Children: N/A  . Years of Education: N/A   Occupational History  . Not on file.   Social History Main Topics  . Smoking status: Never Smoker   . Smokeless tobacco: Not on file  . Alcohol Use: 1.8 oz/week    3 Glasses of wine per week  . Drug Use: No  . Sexual Activity: Not on file   Other Topics Concern  . Not on file   Social History Narrative    Past Surgical History  Procedure Laterality Date  . Tubal ligation    . Breast surgery      biopsy  . Laser ablation      x2    No family history on file.  No Known Allergies  Current Outpatient Prescriptions on File Prior to Visit  Medication Sig Dispense Refill  . diltiazem (CARDIZEM CD) 180 MG 24 hr capsule Take 1 capsule (180 mg total) by mouth daily. 90  capsule 3  . Fluticasone-Salmeterol (ADVAIR DISKUS) 250-50 MCG/DOSE AEPB Inhale 1 puff into the lungs 2 (two) times daily. 180 each 3  . PROAIR HFA 108 (90 BASE) MCG/ACT inhaler INHALE 2 PUFFS BY MOUTH INTO LUNGS EVERY 6 HOURS AS NEEDED FOR WHEEZING 3 Inhaler 3  . propranolol (INDERAL) 40 MG tablet Take 1 tablet (40 mg total) by mouth 3 (three) times daily. As needed 270 tablet 3  . SYNTHROID 150 MCG tablet Take 1 tablet (150 mcg total) by mouth daily before breakfast. 90 tablet 1   No current facility-administered medications on file prior to visit.    BP 122/86 mmHg  Pulse 74  Temp(Src) 98 F (36.7 C) (Oral)  Resp 20  Ht 5\' 4"  (1.626 m)  Wt 190 lb (86.183 kg)  BMI 32.60 kg/m2  SpO2 97%     Review of Systems  Constitutional: Negative.   HENT: Negative for congestion, dental problem, hearing loss, rhinorrhea, sinus pressure, sore throat and tinnitus.   Eyes: Negative for pain, discharge and visual disturbance.  Respiratory: Negative for cough and shortness of breath.   Cardiovascular: Negative for chest pain, palpitations and leg swelling.  Gastrointestinal: Negative for nausea, vomiting, abdominal pain,  diarrhea, constipation, blood in stool and abdominal distention.  Genitourinary: Negative for dysuria, urgency, frequency, hematuria, flank pain, vaginal bleeding, vaginal discharge, difficulty urinating, vaginal pain and pelvic pain.  Musculoskeletal: Negative for joint swelling, arthralgias and gait problem.  Skin: Negative for rash.  Neurological: Negative for dizziness, syncope, speech difficulty, weakness, numbness and headaches.  Hematological: Negative for adenopathy.  Psychiatric/Behavioral: Negative for behavioral problems, dysphoric mood and agitation. The patient is not nervous/anxious.        Objective:   Physical Exam  Constitutional: She is oriented to person, place, and time. She appears well-developed and well-nourished.  HENT:  Head: Normocephalic.  Right  Ear: External ear normal.  Left Ear: External ear normal.  Mouth/Throat: Oropharynx is clear and moist.  Eyes: Conjunctivae and EOM are normal. Pupils are equal, round, and reactive to light.  Neck: Normal range of motion. Neck supple. No thyromegaly present.  Cardiovascular: Normal rate, regular rhythm, normal heart sounds and intact distal pulses.   Pulmonary/Chest: Effort normal and breath sounds normal.  Abdominal: Soft. Bowel sounds are normal. She exhibits no mass. There is no tenderness.  Musculoskeletal: Normal range of motion.  Lymphadenopathy:    She has no cervical adenopathy.  Neurological: She is alert and oriented to person, place, and time.  Skin: Skin is warm and dry. No rash noted.  Psychiatric: She has a normal mood and affect. Her behavior is normal.          Assessment & Plan:   Hypothyroidism.  We'll check a follow-up TSH in 5 weeks PSVT.  Stable History of asthma, stable Anxiety disorder  Recheck one year or as needed

## 2014-10-30 NOTE — Patient Instructions (Signed)
Limit your sodium (Salt) intake    It is important that you exercise regularly, at least 20 minutes 3 to 4 times per week.  If you develop chest pain or shortness of breath seek  medical attention.  You need to lose weight.  Consider a lower calorie diet and regular exercise.  Return in one year for follow-up 

## 2014-10-30 NOTE — Progress Notes (Signed)
Pre visit review using our clinic review tool, if applicable. No additional management support is needed unless otherwise documented below in the visit note. 

## 2014-12-05 ENCOUNTER — Other Ambulatory Visit (INDEPENDENT_AMBULATORY_CARE_PROVIDER_SITE_OTHER): Payer: 59

## 2014-12-05 DIAGNOSIS — E039 Hypothyroidism, unspecified: Secondary | ICD-10-CM

## 2014-12-05 LAB — TSH: TSH: 0.18 u[IU]/mL — AB (ref 0.35–4.50)

## 2014-12-07 ENCOUNTER — Other Ambulatory Visit: Payer: Self-pay | Admitting: *Deleted

## 2014-12-07 DIAGNOSIS — E039 Hypothyroidism, unspecified: Secondary | ICD-10-CM

## 2014-12-07 MED ORDER — SYNTHROID 125 MCG PO TABS: 125.0000 ug | ORAL_TABLET | Freq: Every day | ORAL | Status: DC

## 2014-12-07 MED ORDER — LEVOTHYROXINE SODIUM 125 MCG PO TABS: 125.0000 ug | ORAL_TABLET | Freq: Every day | ORAL | Status: DC

## 2015-01-15 ENCOUNTER — Other Ambulatory Visit (INDEPENDENT_AMBULATORY_CARE_PROVIDER_SITE_OTHER): Payer: 59

## 2015-01-15 DIAGNOSIS — E039 Hypothyroidism, unspecified: Secondary | ICD-10-CM

## 2015-01-15 LAB — TSH: TSH: 0.13 u[IU]/mL — AB (ref 0.35–4.50)

## 2015-01-16 ENCOUNTER — Other Ambulatory Visit: Payer: Self-pay | Admitting: *Deleted

## 2015-01-16 DIAGNOSIS — E039 Hypothyroidism, unspecified: Secondary | ICD-10-CM

## 2015-01-16 MED ORDER — SYNTHROID 100 MCG PO TABS
100.0000 ug | ORAL_TABLET | Freq: Every day | ORAL | Status: DC
Start: 2015-01-16 — End: 2015-10-23

## 2015-03-02 ENCOUNTER — Other Ambulatory Visit (INDEPENDENT_AMBULATORY_CARE_PROVIDER_SITE_OTHER): Payer: 59

## 2015-03-02 DIAGNOSIS — E039 Hypothyroidism, unspecified: Secondary | ICD-10-CM | POA: Diagnosis not present

## 2015-03-02 LAB — TSH: TSH: 3.23 u[IU]/mL (ref 0.35–4.50)

## 2015-03-09 ENCOUNTER — Telehealth: Payer: Self-pay | Admitting: Internal Medicine

## 2015-03-09 NOTE — Telephone Encounter (Signed)
Please call/notify patient that lab/test/procedure is normal 

## 2015-03-09 NOTE — Telephone Encounter (Signed)
Pt calling for TSH result from 4/29. Please advise

## 2015-03-09 NOTE — Telephone Encounter (Signed)
Pt called told her TSH was normal continue present dose. Pt verbalized understanding.

## 2015-03-09 NOTE — Telephone Encounter (Signed)
Pt would like results of labs. Done last week.  Please call back.

## 2015-03-28 ENCOUNTER — Telehealth: Payer: Self-pay | Admitting: Family Medicine

## 2015-03-28 NOTE — Telephone Encounter (Signed)
Spoke to the pt.  She reported that she had a mammogram in Dec 2015 and Dr. Marylynn Pearson, MD office.  Unsure of the exact date.  Documented in health maintenance.

## 2015-06-07 ENCOUNTER — Encounter: Payer: Self-pay | Admitting: *Deleted

## 2015-07-16 ENCOUNTER — Other Ambulatory Visit: Payer: Self-pay | Admitting: Internal Medicine

## 2015-10-23 ENCOUNTER — Other Ambulatory Visit: Payer: Self-pay | Admitting: Internal Medicine

## 2015-10-23 MED ORDER — DILTIAZEM HCL ER COATED BEADS 180 MG PO CP24: 180.0000 mg | ORAL_CAPSULE | Freq: Every day | ORAL | Status: DC

## 2015-10-23 MED ORDER — SYNTHROID 100 MCG PO TABS: 100.0000 ug | ORAL_TABLET | Freq: Every day | ORAL | Status: DC

## 2015-11-15 DIAGNOSIS — M8580 Other specified disorders of bone density and structure, unspecified site: Secondary | ICD-10-CM | POA: Diagnosis not present

## 2015-11-15 DIAGNOSIS — R5383 Other fatigue: Secondary | ICD-10-CM | POA: Diagnosis not present

## 2015-11-15 DIAGNOSIS — E0590 Thyrotoxicosis, unspecified without thyrotoxic crisis or storm: Secondary | ICD-10-CM | POA: Diagnosis not present

## 2015-11-15 DIAGNOSIS — E785 Hyperlipidemia, unspecified: Secondary | ICD-10-CM | POA: Diagnosis not present

## 2015-11-15 DIAGNOSIS — Z131 Encounter for screening for diabetes mellitus: Secondary | ICD-10-CM | POA: Diagnosis not present

## 2015-11-15 LAB — TSH: TSH: 7.24 u[IU]/mL — AB (ref 0.41–5.90)

## 2015-11-15 LAB — LIPID PANEL
CHOLESTEROL: 191 mg/dL (ref 0–200)
HDL: 41 mg/dL (ref 35–70)
LDL CALC: 104 mg/dL
Triglycerides: 232 mg/dL — AB (ref 40–160)

## 2015-11-15 LAB — BASIC METABOLIC PANEL: Glucose: 120 mg/dL

## 2015-11-29 ENCOUNTER — Encounter: Payer: Self-pay | Admitting: Internal Medicine

## 2015-11-29 MED FILL — VIT D2 1.25 MG (50,000 UNIT: 1.25 MG | 84 days supply | Qty: 12 | Fill #0

## 2015-12-03 ENCOUNTER — Ambulatory Visit (INDEPENDENT_AMBULATORY_CARE_PROVIDER_SITE_OTHER): Payer: 59 | Admitting: Internal Medicine

## 2015-12-03 ENCOUNTER — Encounter: Payer: Self-pay | Admitting: Internal Medicine

## 2015-12-03 VITALS — BP 120/84 | HR 75 | Temp 98.2°F | Resp 20 | Ht 64.0 in | Wt 192.0 lb

## 2015-12-03 DIAGNOSIS — Z23 Encounter for immunization: Secondary | ICD-10-CM | POA: Diagnosis not present

## 2015-12-03 DIAGNOSIS — E039 Hypothyroidism, unspecified: Secondary | ICD-10-CM | POA: Diagnosis not present

## 2015-12-03 DIAGNOSIS — I471 Supraventricular tachycardia: Secondary | ICD-10-CM

## 2015-12-03 MED ORDER — ALPRAZOLAM 0.5 MG PO TABS: 0.5000 mg | ORAL_TABLET | Freq: Two times a day (BID) | ORAL | Status: DC | PRN

## 2015-12-03 MED ORDER — LEVOTHYROXINE SODIUM 112 MCG PO TABS: 112.0000 ug | ORAL_TABLET | Freq: Every day | ORAL | Status: DC

## 2015-12-03 MED FILL — SYNTHROID 112 MCG TABLET: 112 | 90 days supply | Qty: 90 | Fill #0

## 2015-12-03 NOTE — Progress Notes (Signed)
Subjective:    Patient ID: Kristin Leonard, female    DOB: [birth year], 65 y.o.   MRN: [id number]  HPI  65 year old patient who has a history of PSVT.  She has had ablation times 2.  She has hypothyroidism and recently TSH was slightly elevated.  She has been very compliant with levothyroxine.  She has a history of asthma, which has been stable.  No new concerns or complaints.  Past Medical History  Diagnosis Date  . HYPOTHYROIDISM 02/07/2010  . CORONARY ARTERY SPASM 04/26/2007  . ASTHMA 04/26/2007  . NECK PAIN 02/01/2008  . SUPRAVENTRICULAR TACHYCARDIA, HX OF 04/26/2007    Social History   Social History  . Marital Status: Married    Spouse Name: N/A  . Number of Children: N/A  . Years of Education: N/A   Occupational History  . Not on file.   Social History Main Topics  . Smoking status: Never Smoker   . Smokeless tobacco: Not on file  . Alcohol Use: 1.8 oz/week    3 Glasses of wine per week  . Drug Use: No  . Sexual Activity: Not on file   Other Topics Concern  . Not on file   Social History Narrative    Past Surgical History  Procedure Laterality Date  . Tubal ligation    . Breast surgery      biopsy  . Laser ablation      x2    No family history on file.  No Known Allergies  Current Outpatient Prescriptions on File Prior to Visit  Medication Sig Dispense Refill  . ALPRAZolam (XANAX) 0.5 MG tablet TAKE 1 TABLET BY MOUTH TWICE DAILY AS NEEDED 60 tablet 2  . diltiazem (CARDIZEM CD) 180 MG 24 hr capsule Take 1 capsule (180 mg total) by mouth daily. 30 capsule 0  . Fluticasone-Salmeterol (ADVAIR DISKUS) 250-50 MCG/DOSE AEPB Inhale 1 puff into the lungs 2 (two) times daily. 180 each 3  . PROAIR HFA 108 (90 BASE) MCG/ACT inhaler INHALE 2 PUFFS BY MOUTH INTO LUNGS EVERY 6 HOURS AS NEEDED FOR WHEEZING 3 Inhaler 3  . propranolol (INDERAL) 40 MG tablet Take 1 tablet (40 mg total) by mouth 3 (three) times daily. As needed 270 tablet 3  . SYNTHROID 100 MCG tablet  Take 1 tablet (100 mcg total) by mouth daily before breakfast. 30 tablet 0   No current facility-administered medications on file prior to visit.    BP 120/84 mmHg  Pulse 75  Temp(Src) 98.2 F (36.8 C) (Oral)  Resp 20  Ht 5\' 4"  (1.626 m)  Wt 192 lb (87.091 kg)  BMI 32.94 kg/m2  SpO2 98%     Review of Systems  Constitutional: Negative.   HENT: Negative for congestion, dental problem, hearing loss, rhinorrhea, sinus pressure, sore throat and tinnitus.   Eyes: Negative for pain, discharge and visual disturbance.  Respiratory: Negative for cough and shortness of breath.   Cardiovascular: Positive for palpitations. Negative for chest pain and leg swelling.  Gastrointestinal: Negative for nausea, vomiting, abdominal pain, diarrhea, constipation, blood in stool and abdominal distention.  Genitourinary: Negative for dysuria, urgency, frequency, hematuria, flank pain, vaginal bleeding, vaginal discharge, difficulty urinating, vaginal pain and pelvic pain.  Musculoskeletal: Negative for joint swelling, arthralgias and gait problem.  Skin: Negative for rash.  Neurological: Negative for dizziness, syncope, speech difficulty, weakness, numbness and headaches.  Hematological: Negative for adenopathy.  Psychiatric/Behavioral: Negative for behavioral problems, dysphoric mood and agitation. The patient is not nervous/anxious.  Objective:   Physical Exam  Constitutional: She is oriented to person, place, and time. She appears well-developed and well-nourished.  HENT:  Head: Normocephalic.  Right Ear: External ear normal.  Left Ear: External ear normal.  Mouth/Throat: Oropharynx is clear and moist.  Eyes: Conjunctivae and EOM are normal. Pupils are equal, round, and reactive to light.  Neck: Normal range of motion. Neck supple. No thyromegaly present.  Cardiovascular: Normal rate, regular rhythm, normal heart sounds and intact distal pulses.   Pulmonary/Chest: Effort normal and breath  sounds normal.  Abdominal: Soft. Bowel sounds are normal. She exhibits no mass. There is no tenderness.  Musculoskeletal: Normal range of motion.  Lymphadenopathy:    She has no cervical adenopathy.  Neurological: She is alert and oriented to person, place, and time.  Skin: Skin is warm and dry. No rash noted.  Psychiatric: She has a normal mood and affect. Her behavior is normal.          Assessment & Plan:   Hypothyroidism.  We'll increase levothyroxine to 0.112 milligrams daily.  Recheck TSH in 2 months PSVT.  Stable Asthma, stable  All medications refilled Recheck one year or as needed.take care of her skeletal health changes at 1 minute

## 2015-12-03 NOTE — Patient Instructions (Signed)
Limit your sodium (Salt) intake    It is important that you exercise regularly, at least 20 minutes 3 to 4 times per week.  If you develop chest pain or shortness of breath seek  medical attention.  Return in one year for follow-up   

## 2015-12-03 NOTE — Progress Notes (Signed)
Pre visit review using our clinic review tool, if applicable. No additional management support is needed unless otherwise documented below in the visit note. 

## 2015-12-04 ENCOUNTER — Other Ambulatory Visit: Payer: Self-pay | Admitting: Internal Medicine

## 2015-12-05 MED FILL — PROPRANOLOL 40 MG TABLET: 40 | 90 days supply | Qty: 270 | Fill #0

## 2015-12-05 MED FILL — DILTIAZEM 24HR ER 180 MG CA: 180 | 30 days supply | Qty: 30 | Fill #0

## 2015-12-05 MED FILL — VENTOLIN HFA 90 MCG INHALER: 108 (90 BAS | 25 days supply | Qty: 18 | Fill #0

## 2015-12-06 MED FILL — ALPRAZolam 0.5 MG TABS: 0.5 | 30 days supply | Qty: 60 | Fill #0

## 2015-12-11 DIAGNOSIS — L282 Other prurigo: Secondary | ICD-10-CM | POA: Diagnosis not present

## 2015-12-11 DIAGNOSIS — L309 Dermatitis, unspecified: Secondary | ICD-10-CM | POA: Diagnosis not present

## 2015-12-11 DIAGNOSIS — L821 Other seborrheic keratosis: Secondary | ICD-10-CM | POA: Diagnosis not present

## 2015-12-11 MED FILL — TRIAMCINOLONE/CETAPHIL CRM: 0.1% | 14 days supply | Qty: 454 | Fill #0

## 2015-12-11 MED FILL — HYDROCORTISONE 2.5% CREAM: 2.5 | 14 days supply | Qty: 30 | Fill #0

## 2015-12-31 DIAGNOSIS — R0602 Shortness of breath: Secondary | ICD-10-CM | POA: Diagnosis not present

## 2015-12-31 DIAGNOSIS — R05 Cough: Secondary | ICD-10-CM | POA: Diagnosis not present

## 2015-12-31 DIAGNOSIS — J209 Acute bronchitis, unspecified: Secondary | ICD-10-CM | POA: Diagnosis not present

## 2016-01-04 MED FILL — AMOX TR-K CLV 875-125 MG TA: 875-125 | 5 days supply | Qty: 10 | Fill #0

## 2016-01-08 DIAGNOSIS — L309 Dermatitis, unspecified: Secondary | ICD-10-CM | POA: Diagnosis not present

## 2016-01-08 DIAGNOSIS — L820 Inflamed seborrheic keratosis: Secondary | ICD-10-CM | POA: Diagnosis not present

## 2016-01-14 ENCOUNTER — Ambulatory Visit (INDEPENDENT_AMBULATORY_CARE_PROVIDER_SITE_OTHER): Payer: 59 | Admitting: Adult Health

## 2016-01-14 VITALS — BP 132/80 | HR 97 | Temp 97.9°F

## 2016-01-14 DIAGNOSIS — J209 Acute bronchitis, unspecified: Secondary | ICD-10-CM | POA: Diagnosis not present

## 2016-01-14 MED ORDER — HYDROCODONE-HOMATROPINE 5-1.5 MG/5ML PO SYRP
5.0000 mL | ORAL_SOLUTION | Freq: Three times a day (TID) | ORAL | Status: DC | PRN
Start: 2016-01-14 — End: 2017-03-09

## 2016-01-14 MED ORDER — PREDNISONE 10 MG PO TABS: 10.0000 mg | ORAL_TABLET | Freq: Every day | ORAL | Status: DC

## 2016-01-14 MED FILL — predniSONE 10 MG TABS: 10 | 9 days supply | Qty: 21 | Fill #0

## 2016-01-14 NOTE — Patient Instructions (Signed)
It was great meeting you today!  I have sent in a prescription for prednisone. Take as directed  40 mg x 3 days 20 mg x 3 days 10 mg x 3 days  Use the cough syrup at night and Mucinex liquid during the day

## 2016-01-14 NOTE — Progress Notes (Signed)
Pt declined weight//acm

## 2016-01-14 NOTE — Progress Notes (Signed)
Subjective:    Patient ID: Kristin Leonard, female    DOB: Jul 10, 1951, 65 y.o.   MRN: [id number]  HPI  65 year old female who presents to the office today for Urgent Care follow up. She was seen at [facility] 2 weeks ago and diagnosed with Bronchitis,was given a 5 day course of Augmentin and Prednisone taper. She continues to have constant coughing.   Has also been using Hydrocodone cough syrup with relief at night.   Denies any fevers ,n/v/d   Has not used any OTC medications  Review of Systems  HENT: Positive for congestion.   Respiratory: Positive for cough, shortness of breath and stridor.   Cardiovascular: Negative.   Gastrointestinal: Negative.   Musculoskeletal: Negative.   Neurological: Negative.   All other systems reviewed and are negative.  Past Medical History  Diagnosis Date  . HYPOTHYROIDISM 02/07/2010  . CORONARY ARTERY SPASM 04/26/2007  . ASTHMA 04/26/2007  . NECK PAIN 02/01/2008  . SUPRAVENTRICULAR TACHYCARDIA, HX OF 04/26/2007    Social History   Social History  . Marital Status: Married    Spouse Name: N/A  . Number of Children: N/A  . Years of Education: N/A   Occupational History  . Not on file.   Social History Main Topics  . Smoking status: Never Smoker   . Smokeless tobacco: Not on file  . Alcohol Use: 1.8 oz/week    3 Glasses of wine per week  . Drug Use: No  . Sexual Activity: Not on file   Other Topics Concern  . Not on file   Social History Narrative    Past Surgical History  Procedure Laterality Date  . Tubal ligation    . Breast surgery      biopsy  . Laser ablation      x2    No family history on file.  No Known Allergies  Current Outpatient Prescriptions on File Prior to Visit  Medication Sig Dispense Refill  . ALPRAZolam (XANAX) 0.5 MG tablet Take 1 tablet (0.5 mg total) by mouth 2 (two) times daily as needed. 60 tablet 2  . diltiazem (CARDIZEM CD) 180 MG 24 hr capsule TAKE 1 CAPSULE BY MOUTH DAILY. 30 capsule  5  . Fluticasone-Salmeterol (ADVAIR DISKUS) 250-50 MCG/DOSE AEPB Inhale 1 puff into the lungs 2 (two) times daily. 180 each 3  . levothyroxine (SYNTHROID, LEVOTHROID) 112 MCG tablet Take 1 tablet (112 mcg total) by mouth daily. 90 tablet 3  . propranolol (INDERAL) 40 MG tablet TAKE 1 TABLET BY MOUTH 3 TIMES DAILY AS NEEDED 270 tablet 3  . VENTOLIN HFA 108 (90 Base) MCG/ACT inhaler INHALE 2 PUFSS BY MOUTH EVERY 6 HOURS AS NEEDED FOR WHEEZING 18 g 4  . Vitamin D, Ergocalciferol, (DRISDOL) 50000 units CAPS capsule   1   No current facility-administered medications on file prior to visit.    BP 132/80 mmHg  Pulse 97  Temp(Src) 97.9 F (36.6 C) (Oral)  SpO2 97%       Objective:   Physical Exam  Constitutional: She is oriented to person, place, and time. She appears well-developed and well-nourished. No distress.  HENT:  Head: Normocephalic and atraumatic.  Right Ear: External ear normal.  Left Ear: External ear normal.  Nose: Nose normal.  Mouth/Throat: Oropharynx is clear and moist. No oropharyngeal exudate.  Neck: Normal range of motion. Neck supple.  Cardiovascular: Normal rate, regular rhythm, normal heart sounds and intact distal pulses.  Exam reveals no gallop and no  friction rub.   No murmur heard. Pulmonary/Chest: Effort normal and breath sounds normal. No respiratory distress. She has no wheezes. She has no rales. She exhibits no tenderness.  Lymphadenopathy:    She has no cervical adenopathy.  Neurological: She is alert and oriented to person, place, and time.  Skin: Skin is warm and dry. No rash noted. She is not diaphoretic. No erythema. No pallor.  Psychiatric: She has a normal mood and affect. Her behavior is normal. Thought content normal.  Nursing note and vitals reviewed.      Assessment & Plan:  1. Acute bronchitis, unspecified organism - predniSONE (DELTASONE) 10 MG tablet; Take 1 tablet (10 mg total) by mouth daily with breakfast. 40mg  x 3 days, 20 mg x 3  days, 10 mg x 3 days  Dispense: 21 tablet; Refill: 0 - HYDROcodone-homatropine (HYCODAN) 5-1.5 MG/5ML syrup; Take 5 mLs by mouth every 8 (eight) hours as needed for cough.  Dispense: 120 mL; Refill: 0 -Mucinex cough liquid

## 2016-01-15 MED FILL — HYDROCODONE-HOMATROPINE SYR: 5-1.5 | 8 days supply | Qty: 120 | Fill #0

## 2016-01-15 MED FILL — DILTIAZEM 24HR ER 180 MG CA: 180 | 30 days supply | Qty: 30 | Fill #1

## 2016-01-24 ENCOUNTER — Telehealth: Payer: Self-pay | Admitting: Internal Medicine

## 2016-01-24 NOTE — Telephone Encounter (Signed)
Pt saw Kristin Leonard 3/13 for upper respiratory. Pt states it has moved to her sinuses, and pt is still coughing. Pt has a lot of nasal congestion, eyes, cheeks, jaw hurt. Headache included. Pt has cough syrup, and inhaler, but would like a abx, perhaps zpak, to help this clear up  No appointments w/ Dr Raliegh Ip. Pt is supposed to go out of town this weekend.  [place] [facility] pharm

## 2016-01-24 NOTE — Telephone Encounter (Signed)
I would be happy to see her tomorrow.

## 2016-01-24 NOTE — Telephone Encounter (Signed)
Pt has scheduled appointment for tomorrow.

## 2016-01-25 ENCOUNTER — Ambulatory Visit: Payer: 59 | Admitting: Adult Health

## 2016-02-01 ENCOUNTER — Encounter: Payer: Self-pay | Admitting: Family Medicine

## 2016-02-01 ENCOUNTER — Ambulatory Visit (INDEPENDENT_AMBULATORY_CARE_PROVIDER_SITE_OTHER): Payer: 59 | Admitting: Family Medicine

## 2016-02-01 ENCOUNTER — Ambulatory Visit (INDEPENDENT_AMBULATORY_CARE_PROVIDER_SITE_OTHER)
Admission: RE | Admit: 2016-02-01 | Discharge: 2016-02-01 | Disposition: A | Discharge status: Home / Self Care | Payer: 59 | Source: Ambulatory Visit | Attending: Family Medicine | Admitting: Family Medicine

## 2016-02-01 VITALS — BP 128/90 | HR 86 | Temp 98.6°F

## 2016-02-01 DIAGNOSIS — R05 Cough: Secondary | ICD-10-CM | POA: Diagnosis not present

## 2016-02-01 DIAGNOSIS — J45901 Unspecified asthma with (acute) exacerbation: Secondary | ICD-10-CM | POA: Diagnosis not present

## 2016-02-01 DIAGNOSIS — J988 Other specified respiratory disorders: Secondary | ICD-10-CM

## 2016-02-01 MED ORDER — BUDESONIDE-FORMOTEROL FUMARATE 160-4.5 MCG/ACT IN AERO: 2.0000 | INHALATION_SPRAY | Freq: Two times a day (BID) | RESPIRATORY_TRACT | Status: DC

## 2016-02-01 MED ORDER — IPRATROPIUM-ALBUTEROL 0.5-2.5 (3) MG/3ML IN SOLN
3.0000 mL | Freq: Four times a day (QID) | RESPIRATORY_TRACT | Status: DC
  Administered 2016-02-01: 3 mL via RESPIRATORY_TRACT

## 2016-02-01 MED ORDER — DOXYCYCLINE HYCLATE 100 MG PO TABS: 100.0000 mg | ORAL_TABLET | Freq: Two times a day (BID) | ORAL | Status: DC

## 2016-02-01 MED FILL — DOXYCYCLINE HYCLATE 100 MG: 100 | 10 days supply | Qty: 20 | Fill #0

## 2016-02-01 MED FILL — SYMBICORT 160-4.5 MCG INH: 160-4.5 | 30 days supply | Qty: 10 | Fill #0

## 2016-02-01 NOTE — Progress Notes (Signed)
Pre visit review using our clinic review tool, if applicable. No additional management support is needed unless otherwise documented below in the visit note. 

## 2016-02-01 NOTE — Patient Instructions (Signed)
Symptoms could be residual from prior URI and persistent because hx of asthma and allergies.Still antibiotic recommended. Please follow as we discussed.

## 2016-02-01 NOTE — Progress Notes (Signed)
Subjective:    Patient ID: Kristin Leonard, female    DOB: 09-Jul-1951, 65 y.o.   MRN: [id number]  HPI  Kristin Leonard is a pleasant 65 y.o. female who is here today complaining of respiratory symptoms since February 2017.  She started with cough and wheezing on February 20/2017, she was evaluated at [facility] and started on Augmentin (5 days course) and Prednisone. She was reevaluated about 2 weeks ago because persistent symptoms, another course of prednisone was recommended. She is concerned because persistent cough with wheezing, both improved with oral Prednisone but still present. Now she has sinus pressure and nasal congestion. She thinks she may have a "sinus infection." She has not had any fever or body aches. Occasional chills and exertional dyspnea. She has history of asthma and currently she is on albuterol, which she has been using about 2-3 times per day since February.She is not longer on Advair. She denies any chest pain, palpitation/irregular HR, LE edema. No tobbaco use. No sick contact or travel hx.  No known insect bite.   Current Outpatient Prescriptions on File Prior to Visit  Medication Sig Dispense Refill  . ALPRAZolam (XANAX) 0.5 MG tablet Take 1 tablet (0.5 mg total) by mouth 2 (two) times daily as needed. 60 tablet 2  . diltiazem (CARDIZEM CD) 180 MG 24 hr capsule TAKE 1 CAPSULE BY MOUTH DAILY. 30 capsule 5  . HYDROcodone-homatropine (HYCODAN) 5-1.5 MG/5ML syrup Take 5 mLs by mouth every 8 (eight) hours as needed for cough. 120 mL 0  . levothyroxine (SYNTHROID, LEVOTHROID) 112 MCG tablet Take 1 tablet (112 mcg total) by mouth daily. 90 tablet 3  . propranolol (INDERAL) 40 MG tablet TAKE 1 TABLET BY MOUTH 3 TIMES DAILY AS NEEDED 270 tablet 3  . VENTOLIN HFA 108 (90 Base) MCG/ACT inhaler INHALE 2 PUFSS BY MOUTH EVERY 6 HOURS AS NEEDED FOR WHEEZING 18 g 4  . Vitamin D, Ergocalciferol, (DRISDOL) 50000 units CAPS capsule   1  . Fluticasone-Salmeterol  (ADVAIR DISKUS) 250-50 MCG/DOSE AEPB Inhale 1 puff into the lungs 2 (two) times daily. 180 each 3  . predniSONE (DELTASONE) 10 MG tablet Take 1 tablet (10 mg total) by mouth daily with breakfast. 40mg  x 3 days, 20 mg x 3 days, 10 mg x 3 days (Patient not taking: Reported on 02/01/2016) 21 tablet 0   No current facility-administered medications on file prior to visit.     Past Medical History  Diagnosis Date  . HYPOTHYROIDISM 02/07/2010  . CORONARY ARTERY SPASM 04/26/2007  . ASTHMA 04/26/2007  . NECK PAIN 02/01/2008  . SUPRAVENTRICULAR TACHYCARDIA, HX OF 04/26/2007    Social History   Social History Narrative  No smoker.   Review of Systems  Constitutional: Negative for fever, activity change, appetite change and fatigue.  HENT: Positive for congestion, postnasal drip, sinus pressure and voice change. Negative for ear pain, facial swelling, mouth sores, sneezing, sore throat and trouble swallowing.   Eyes: Negative for discharge, redness and itching.  Respiratory: Positive for cough, shortness of breath (occasional exertional dyspnea) and wheezing.   Cardiovascular: Negative for chest pain, palpitations and leg swelling.  Gastrointestinal: Negative for nausea, vomiting, abdominal pain and diarrhea.  Musculoskeletal: Negative for myalgias, back pain, joint swelling and neck pain.  Skin: Negative for rash.  Allergic/Immunologic: Positive for environmental allergies.  Neurological: Negative for weakness, numbness and headaches.  Hematological: Negative for adenopathy.   Filed Vitals:   02/01/16 1307  BP: 128/90  Pulse: 86  Temp: 98.6 F (37 C)   SpO2 Readings from Last 3 Encounters:  02/01/16 95%  01/14/16 97%  12/03/15 98%       Objective:   Physical Exam  Constitutional: She is oriented to person, place, and time. She appears well-developed and well-nourished.  HENT:  Head: Normocephalic and atraumatic.  Right Ear: Tympanic membrane, external ear and ear canal normal.    Left Ear: Tympanic membrane, external ear and ear canal normal.  Nose: Rhinorrhea present. No mucosal edema. Right sinus exhibits maxillary sinus tenderness and frontal sinus tenderness. Left sinus exhibits maxillary sinus tenderness and frontal sinus tenderness.  Mouth/Throat: Uvula is midline and mucous membranes are normal. No oropharyngeal exudate, posterior oropharyngeal edema or posterior oropharyngeal erythema.  Hypertrophic turbinates, mild tenderness upon sinus palpation. Post nasal drainage  Eyes: Conjunctivae are normal.  Neck: Neck supple.  Cardiovascular: Normal rate and regular rhythm.   Pulmonary/Chest: Effort normal. No respiratory distress. She has wheezes. She has rhonchi in the right middle field and the right lower field. She has no rales.  Cough during examination. O2 98% RA after Duoneb  Lymphadenopathy:    She has no cervical adenopathy.  Neurological: She is alert and oriented to person, place, and time.  Skin: Skin is warm. No rash noted.  Psychiatric: She has a normal mood and affect.       Assessment & Plan:  Kristin Leonard was seen today for sinus problem.  Diagnoses and all orders for this visit:  Asthma with exacerbation, unspecified asthma severity -     ipratropium-albuterol (DUONEB) 0.5-2.5 (3) MG/3ML nebulizer solution 3 mL; Take 3 mLs by nebulization every 6 (six) hours. -     DG Chest 2 View; Future -     budesonide-formoterol (SYMBICORT) 160-4.5 MCG/ACT inhaler; Inhale 2 puffs into the lungs 2 (two) times daily.  Respiratory tract infection -     doxycycline (VIBRA-TABS) 100 MG tablet; Take 1 tablet (100 mg total) by mouth 2 (two) times daily.  - Because persistent symptoms since 12/2015 with frequent need for Albuterol inh, Symbicort was recommended. Albuterol inh to continue 2 puff q 6 hours for a week and then as needed. 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  -DBP slightly elevated, reporting home  BP WNL.  -After Duoneb treatment wheezing resolved but still course sounds, rhonchi like on right mid/lower lung. No rales, no respiratory distress, and cough improved. CXR was ordered and abx started. Clearly instructed about warning signs. F/U in 2 weeks, before if needed.   Kristin G. Martinique, MD  [facility]. [place] office.

## 2016-02-06 DIAGNOSIS — D2271 Melanocytic nevi of right lower limb, including hip: Secondary | ICD-10-CM | POA: Diagnosis not present

## 2016-02-06 DIAGNOSIS — D225 Melanocytic nevi of trunk: Secondary | ICD-10-CM | POA: Diagnosis not present

## 2016-02-06 DIAGNOSIS — L309 Dermatitis, unspecified: Secondary | ICD-10-CM | POA: Diagnosis not present

## 2016-02-06 DIAGNOSIS — L821 Other seborrheic keratosis: Secondary | ICD-10-CM | POA: Diagnosis not present

## 2016-02-06 DIAGNOSIS — L723 Sebaceous cyst: Secondary | ICD-10-CM | POA: Diagnosis not present

## 2016-02-06 DIAGNOSIS — D2372 Other benign neoplasm of skin of left lower limb, including hip: Secondary | ICD-10-CM | POA: Diagnosis not present

## 2016-02-07 ENCOUNTER — Other Ambulatory Visit (INDEPENDENT_AMBULATORY_CARE_PROVIDER_SITE_OTHER): Payer: 59

## 2016-02-07 DIAGNOSIS — E039 Hypothyroidism, unspecified: Secondary | ICD-10-CM

## 2016-02-07 LAB — TSH: TSH: 1.51 u[IU]/mL (ref 0.35–4.50)

## 2016-02-18 ENCOUNTER — Ambulatory Visit (INDEPENDENT_AMBULATORY_CARE_PROVIDER_SITE_OTHER): Payer: 59 | Admitting: Internal Medicine

## 2016-02-18 ENCOUNTER — Encounter: Payer: Self-pay | Admitting: Internal Medicine

## 2016-02-18 VITALS — BP 110/80 | HR 76 | Temp 98.6°F | Resp 20 | Ht 64.0 in | Wt 192.0 lb

## 2016-02-18 DIAGNOSIS — J45901 Unspecified asthma with (acute) exacerbation: Secondary | ICD-10-CM | POA: Diagnosis not present

## 2016-02-18 DIAGNOSIS — I471 Supraventricular tachycardia: Secondary | ICD-10-CM

## 2016-02-18 DIAGNOSIS — E039 Hypothyroidism, unspecified: Secondary | ICD-10-CM

## 2016-02-18 DIAGNOSIS — J4520 Mild intermittent asthma, uncomplicated: Secondary | ICD-10-CM | POA: Diagnosis not present

## 2016-02-18 MED ORDER — BUDESONIDE-FORMOTEROL FUMARATE 160-4.5 MCG/ACT IN AERO: 2.0000 | INHALATION_SPRAY | Freq: Two times a day (BID) | RESPIRATORY_TRACT | Status: DC

## 2016-02-18 MED ORDER — LEVOTHYROXINE SODIUM 112 MCG PO TABS: 112.0000 ug | ORAL_TABLET | Freq: Every day | ORAL | Status: DC

## 2016-02-18 MED FILL — SYNTHROID 112 MCG TABLET: 112 | 90 days supply | Qty: 90 | Fill #0

## 2016-02-18 NOTE — Progress Notes (Signed)
Subjective:    Patient ID: Kristin Leonard, female    DOB: Jul 30, 1951, 65 y.o.   MRN: [id number]  HPI  65 year old patient who has a history of uncomplicated asthma.  Over the past several weeks.  She has been evaluated and treated by multiple providers due to asthma exacerbation.  She has received 2 rounds of antibiotics and 2 courses of prednisone. Over the past week or 2.  She has greatly improved, especially over the past 5 days.  She is now on a maintenance dose of Symbicort.  She has been on Advair in the past. She feels her symptoms are resolved and she is now back to baseline.  She states that she still requires albuterol once every day or 2  Past Medical History  Diagnosis Date  . HYPOTHYROIDISM 02/07/2010  . CORONARY ARTERY SPASM 04/26/2007  . ASTHMA 04/26/2007  . NECK PAIN 02/01/2008  . SUPRAVENTRICULAR TACHYCARDIA, HX OF 04/26/2007     Social History   Social History  . Marital Status: Married    Spouse Name: N/A  . Number of Children: N/A  . Years of Education: N/A   Occupational History  . Not on file.   Social History Main Topics  . Smoking status: Never Smoker   . Smokeless tobacco: Not on file  . Alcohol Use: 1.8 oz/week    3 Glasses of wine per week  . Drug Use: No  . Sexual Activity: Not on file   Other Topics Concern  . Not on file   Social History Narrative    Past Surgical History  Procedure Laterality Date  . Tubal ligation    . Breast surgery      biopsy  . Laser ablation      x2    No family history on file.  No Known Allergies  Current Outpatient Prescriptions on File Prior to Visit  Medication Sig Dispense Refill  . ALPRAZolam (XANAX) 0.5 MG tablet Take 1 tablet (0.5 mg total) by mouth 2 (two) times daily as needed. 60 tablet 2  . diltiazem (CARDIZEM CD) 180 MG 24 hr capsule TAKE 1 CAPSULE BY MOUTH DAILY. 30 capsule 5  . HYDROcodone-homatropine (HYCODAN) 5-1.5 MG/5ML syrup Take 5 mLs by mouth every 8 (eight) hours as needed for  cough. 120 mL 0  . propranolol (INDERAL) 40 MG tablet TAKE 1 TABLET BY MOUTH 3 TIMES DAILY AS NEEDED 270 tablet 3  . VENTOLIN HFA 108 (90 Base) MCG/ACT inhaler INHALE 2 PUFSS BY MOUTH EVERY 6 HOURS AS NEEDED FOR WHEEZING 18 g 4  . Vitamin D, Ergocalciferol, (DRISDOL) 50000 units CAPS capsule   1   No current facility-administered medications on file prior to visit.    BP 110/80 mmHg  Pulse 76  Temp(Src) 98.6 F (37 C) (Oral)  Resp 20  Ht 5\' 4"  (1.626 m)  Wt 192 lb (87.091 kg)  BMI 32.94 kg/m2  SpO2 97%     Review of Systems  Constitutional: Negative.   HENT: Negative for congestion, dental problem, hearing loss, rhinorrhea, sinus pressure, sore throat and tinnitus.   Eyes: Negative for pain, discharge and visual disturbance.  Respiratory: Positive for cough, shortness of breath and wheezing.   Cardiovascular: Negative for chest pain, palpitations and leg swelling.  Gastrointestinal: Negative for nausea, vomiting, abdominal pain, diarrhea, constipation, blood in stool and abdominal distention.  Genitourinary: Negative for dysuria, urgency, frequency, hematuria, flank pain, vaginal bleeding, vaginal discharge, difficulty urinating, vaginal pain and pelvic pain.  Musculoskeletal: Negative for  joint swelling, arthralgias and gait problem.  Skin: Negative for rash.  Neurological: Negative for dizziness, syncope, speech difficulty, weakness, numbness and headaches.  Hematological: Negative for adenopathy.  Psychiatric/Behavioral: Negative for behavioral problems, dysphoric mood and agitation. The patient is not nervous/anxious.        Objective:   Physical Exam  Constitutional: She is oriented to person, place, and time. She appears well-developed and well-nourished. No distress.  HENT:  Head: Normocephalic.  Right Ear: External ear normal.  Left Ear: External ear normal.  Mouth/Throat: Oropharynx is clear and moist.  Eyes: Conjunctivae and EOM are normal. Pupils are equal,  round, and reactive to light.  Neck: Normal range of motion. Neck supple. No thyromegaly present.  Cardiovascular: Normal rate, regular rhythm, normal heart sounds and intact distal pulses.   Pulmonary/Chest: Effort normal and breath sounds normal. No respiratory distress. She has no wheezes. She has no rales.  O2 saturation 97  Abdominal: Soft. Bowel sounds are normal. She exhibits no mass. There is no tenderness.  Musculoskeletal: Normal range of motion.  Lymphadenopathy:    She has no cervical adenopathy.  Neurological: She is alert and oriented to person, place, and time.  Skin: Skin is warm and dry. No rash noted.  Psychiatric: She has a normal mood and affect. Her behavior is normal.          Assessment & Plan:   Resolving asthma exacerbation.  Will continue Symbicort through the spring season.  AI early summer, will consider a dose reduction to 1 inhalation twice daily; she remained stable on this dose for 4 weeks.  Will consider discontinuation of Symbicort Hypothyroidism.  Continue present dose of levothyroxine

## 2016-02-18 NOTE — Progress Notes (Signed)
Pre visit review using our clinic review tool, if applicable. No additional management support is needed unless otherwise documented below in the visit note. 

## 2016-02-18 NOTE — Patient Instructions (Signed)
Limit your sodium (Salt) intake  Please check your blood pressure on a regular basis.  If it is consistently greater than 150/90, please make an office appointment.  Return in 6 months for follow-up   

## 2016-02-19 MED FILL — VIT D2 1.25 MG (50,000 UNIT: 1.25 MG | 84 days supply | Qty: 12 | Fill #1

## 2016-02-19 MED FILL — ALPRAZolam 0.5 MG TABS: 0.5 | 30 days supply | Qty: 60 | Fill #1

## 2016-02-19 MED FILL — CARTIA XT 180 MG CAPSULE SA: 180 | 30 days supply | Qty: 30 | Fill #2

## 2016-02-20 DIAGNOSIS — E559 Vitamin D deficiency, unspecified: Secondary | ICD-10-CM | POA: Diagnosis not present

## 2016-03-20 MED FILL — SYMBICORT 160-4.5 MCG INH: 160-4.5 | 30 days supply | Qty: 10 | Fill #1

## 2016-03-20 MED FILL — CARTIA XT 180 MG CAPSULE SA: 180 | 30 days supply | Qty: 30 | Fill #3

## 2016-03-21 ENCOUNTER — Ambulatory Visit (INDEPENDENT_AMBULATORY_CARE_PROVIDER_SITE_OTHER): Payer: 59 | Admitting: Internal Medicine

## 2016-03-21 ENCOUNTER — Encounter: Payer: Self-pay | Admitting: Internal Medicine

## 2016-03-21 VITALS — BP 112/80 | HR 77 | Temp 98.9°F | Ht 64.0 in

## 2016-03-21 DIAGNOSIS — J4520 Mild intermittent asthma, uncomplicated: Secondary | ICD-10-CM | POA: Diagnosis not present

## 2016-03-21 DIAGNOSIS — E039 Hypothyroidism, unspecified: Secondary | ICD-10-CM

## 2016-03-21 MED ORDER — MONTELUKAST SODIUM 10 MG PO TABS: 10.0000 mg | ORAL_TABLET | Freq: Every day | ORAL | Status: DC

## 2016-03-21 MED FILL — MONTELUKAST SOD 10 MG TAB: 10 | 30 days supply | Qty: 30 | Fill #0

## 2016-03-21 NOTE — Progress Notes (Signed)
Pre visit review using our clinic review tool, if applicable. No additional management support is needed unless otherwise documented below in the visit note. 

## 2016-03-21 NOTE — Patient Instructions (Signed)
Acute bronchitis symptoms  are generally not helped by antibiotics.  Take over-the-counter expectorants and cough medications such as  Mucinex DM.  Call if there is no improvement in 5 to 7 days or if  you develop worsening cough, fever, or new symptoms, such as shortness of breath or chest pain. 

## 2016-03-21 NOTE — Progress Notes (Signed)
Subjective:    Patient ID: Kristin Leonard, female    DOB: 24-Jul-1951, 65 y.o.   MRN: [id number]  HPI  65 year old patient who has a history of asthma.  She's had a very difficult spring and early in the season was treated for sinusitis.  She is also treated with steroid therapy. About 2 weeks ago while visiting grandchildren they became ill with sore throat and URI symptoms.  2 days later.  The patient also began having increasing cough, mild sore throat, chest tightness.  She has been on maintenance Symbicort and has required albuterol about once daily.  No significant wheezing.  Cough and congestion have been her major complaints.  No documented fever.  Past Medical History  Diagnosis Date  . HYPOTHYROIDISM 02/07/2010  . CORONARY ARTERY SPASM 04/26/2007  . ASTHMA 04/26/2007  . NECK PAIN 02/01/2008  . SUPRAVENTRICULAR TACHYCARDIA, HX OF 04/26/2007     Social History   Social History  . Marital Status: Married    Spouse Name: N/A  . Number of Children: N/A  . Years of Education: N/A   Occupational History  . Not on file.   Social History Main Topics  . Smoking status: Never Smoker   . Smokeless tobacco: Not on file  . Alcohol Use: 1.8 oz/week    3 Glasses of wine per week  . Drug Use: No  . Sexual Activity: Not on file   Other Topics Concern  . Not on file   Social History Narrative    Past Surgical History  Procedure Laterality Date  . Tubal ligation    . Breast surgery      biopsy  . Laser ablation      x2    No family history on file.  No Known Allergies  Current Outpatient Prescriptions on File Prior to Visit  Medication Sig Dispense Refill  . ALPRAZolam (XANAX) 0.5 MG tablet Take 1 tablet (0.5 mg total) by mouth 2 (two) times daily as needed. 60 tablet 2  . budesonide-formoterol (SYMBICORT) 160-4.5 MCG/ACT inhaler Inhale 2 puffs into the lungs 2 (two) times daily. 1 Inhaler 3  . diltiazem (CARDIZEM CD) 180 MG 24 hr capsule TAKE 1 CAPSULE BY MOUTH DAILY.  30 capsule 5  . HYDROcodone-homatropine (HYCODAN) 5-1.5 MG/5ML syrup Take 5 mLs by mouth every 8 (eight) hours as needed for cough. 120 mL 0  . levothyroxine (SYNTHROID, LEVOTHROID) 112 MCG tablet Take 1 tablet (112 mcg total) by mouth daily. 90 tablet 3  . propranolol (INDERAL) 40 MG tablet TAKE 1 TABLET BY MOUTH 3 TIMES DAILY AS NEEDED 270 tablet 3  . VENTOLIN HFA 108 (90 Base) MCG/ACT inhaler INHALE 2 PUFSS BY MOUTH EVERY 6 HOURS AS NEEDED FOR WHEEZING 18 g 4  . Vitamin D, Ergocalciferol, (DRISDOL) 50000 units CAPS capsule   1   No current facility-administered medications on file prior to visit.    BP 112/80 mmHg  Pulse 77  Temp(Src) 98.9 F (37.2 C) (Oral)  Ht 5\' 4"  (1.626 m)  Wt   SpO2 97%     Review of Systems  Constitutional: Negative.   HENT: Negative for congestion, dental problem, hearing loss, rhinorrhea, sinus pressure, sore throat and tinnitus.   Eyes: Negative for pain, discharge and visual disturbance.  Respiratory: Positive for cough, chest tightness, shortness of breath and wheezing.   Cardiovascular: Negative for chest pain, palpitations and leg swelling.  Gastrointestinal: Negative for nausea, vomiting, abdominal pain, diarrhea, constipation, blood in stool and abdominal distention.  Genitourinary: Negative for dysuria, urgency, frequency, hematuria, flank pain, vaginal bleeding, vaginal discharge, difficulty urinating, vaginal pain and pelvic pain.  Musculoskeletal: Negative for joint swelling, arthralgias and gait problem.  Skin: Negative for rash.  Neurological: Negative for dizziness, syncope, speech difficulty, weakness, numbness and headaches.  Hematological: Negative for adenopathy.  Psychiatric/Behavioral: Negative for behavioral problems, dysphoric mood and agitation. The patient is not nervous/anxious.        Objective:   Physical Exam  Constitutional: She is oriented to person, place, and time. She appears well-developed and well-nourished.    HENT:  Head: Normocephalic.  Right Ear: External ear normal.  Left Ear: External ear normal.  Mouth/Throat: Oropharynx is clear and moist.  Eyes: Conjunctivae and EOM are normal. Pupils are equal, round, and reactive to light.  Neck: Normal range of motion. Neck supple. No thyromegaly present.  Cardiovascular: Normal rate, regular rhythm, normal heart sounds and intact distal pulses.   Pulmonary/Chest: Effort normal and breath sounds normal. No respiratory distress. She has no wheezes. She has no rales.  Abdominal: Soft. Bowel sounds are normal. She exhibits no mass. There is no tenderness.  Musculoskeletal: Normal range of motion.  Lymphadenopathy:    She has no cervical adenopathy.  Neurological: She is alert and oriented to person, place, and time.  Skin: Skin is warm and dry. No rash noted.  Psychiatric: She has a normal mood and affect. Her behavior is normal.          Assessment & Plan:   Asthma exacerbation Resolving URI  Continue present maintenance medications as well as when necessary albuterol.  Options discussed.  Will treat with Singulair for 30 days  Will call if unimproved

## 2016-04-28 MED FILL — CARTIA XT 180 MG CAPSULE SA: 180 | 30 days supply | Qty: 30 | Fill #4

## 2016-04-28 MED FILL — ALPRAZolam 0.5 MG TABS: 0.5 | 30 days supply | Qty: 60 | Fill #2

## 2016-05-12 MED FILL — LOTEMAX 0.5% EYE DROPS: 0.5 | 16 days supply | Qty: 5 | Fill #0

## 2016-05-12 MED FILL — SYNTHROID 112 MCG TABLET: 112 | 90 days supply | Qty: 90 | Fill #1

## 2016-05-26 MED FILL — CARTIA XT 180 MG CAPSULE SA: 180 | 30 days supply | Qty: 30 | Fill #5

## 2016-06-27 ENCOUNTER — Telehealth: Payer: Self-pay | Admitting: Internal Medicine

## 2016-06-29 NOTE — Telephone Encounter (Signed)
Okay to refill? 

## 2016-06-30 NOTE — Telephone Encounter (Signed)
Pt following up on refill request. Pt is going out of town

## 2016-06-30 NOTE — Telephone Encounter (Signed)
Pt's husband notified Rx's called into pharmacy.

## 2016-06-30 NOTE — Telephone Encounter (Signed)
Rx's called into pharmacy.

## 2016-07-01 MED FILL — CARTIA XT 180 MG CAPSULE SA: 180 | 30 days supply | Qty: 30 | Fill #0

## 2016-07-01 MED FILL — ALPRAZolam 0.5 MG TABS: 0.5 | 30 days supply | Qty: 60 | Fill #0

## 2016-07-31 MED FILL — CARTIA XT 180 MG CAPSULE SA: 180 | 30 days supply | Qty: 30 | Fill #1

## 2016-08-11 MED FILL — LOTEMAX 0.5% EYE DROPS: 0.5 | 16 days supply | Qty: 5 | Fill #1

## 2016-08-20 ENCOUNTER — Ambulatory Visit: Payer: 59 | Admitting: Internal Medicine

## 2016-08-26 MED FILL — SYNTHROID 112 MCG TABLET: 112 | 90 days supply | Qty: 90 | Fill #2

## 2016-09-01 MED FILL — CARTIA XT 180 MG CAPSULE SA: 180 | 30 days supply | Qty: 30 | Fill #2

## 2016-09-10 ENCOUNTER — Encounter: Payer: Self-pay | Admitting: Internal Medicine

## 2016-09-30 MED FILL — DILTIAZEM 24HR ER 180 MG CA: 180 | 30 days supply | Qty: 30 | Fill #3

## 2016-10-30 MED FILL — ALPRAZolam 0.5 MG TABS: 0.5 | 30 days supply | Qty: 60 | Fill #1

## 2016-10-30 MED FILL — DILTIAZEM 24HR ER 180 MG CA: 180 | 30 days supply | Qty: 30 | Fill #4

## 2016-11-05 DIAGNOSIS — Z6833 Body mass index (BMI) 33.0-33.9, adult: Secondary | ICD-10-CM | POA: Diagnosis not present

## 2016-11-05 DIAGNOSIS — Z01419 Encounter for gynecological examination (general) (routine) without abnormal findings: Secondary | ICD-10-CM | POA: Diagnosis not present

## 2016-11-05 DIAGNOSIS — Z1231 Encounter for screening mammogram for malignant neoplasm of breast: Secondary | ICD-10-CM | POA: Diagnosis not present

## 2016-12-03 MED FILL — CARTIA XT 180 MG CAPSULE SA: 180 | 30 days supply | Qty: 30 | Fill #5

## 2016-12-03 MED FILL — SYNTHROID 112 MCG TABLET: 112 | 90 days supply | Qty: 90 | Fill #3

## 2017-01-05 ENCOUNTER — Other Ambulatory Visit: Payer: Self-pay | Admitting: Internal Medicine

## 2017-01-05 MED FILL — CARTIA XT 180 MG CAPSULE SA: 180 | 30 days supply | Qty: 30 | Fill #0

## 2017-01-27 ENCOUNTER — Other Ambulatory Visit: Payer: Self-pay | Admitting: Internal Medicine

## 2017-02-02 ENCOUNTER — Other Ambulatory Visit: Payer: Self-pay | Admitting: Internal Medicine

## 2017-02-02 MED FILL — CARTIA XT 180 MG CAPSULE SA: 180 | 30 days supply | Qty: 30 | Fill #1

## 2017-02-03 MED FILL — ALPRAZolam 0.5 MG TABS: 0.5 | 30 days supply | Qty: 60 | Fill #0

## 2017-03-03 ENCOUNTER — Other Ambulatory Visit: Payer: Self-pay | Admitting: Internal Medicine

## 2017-03-03 MED FILL — CARTIA XT 180 MG CAPSULE SA: 180 | 30 days supply | Qty: 30 | Fill #2

## 2017-03-03 MED FILL — SYNTHROID 112 MCG TABLET: 112 | 90 days supply | Qty: 90 | Fill #0

## 2017-03-09 ENCOUNTER — Encounter: Payer: Self-pay | Admitting: Family Medicine

## 2017-03-09 ENCOUNTER — Ambulatory Visit (INDEPENDENT_AMBULATORY_CARE_PROVIDER_SITE_OTHER): Payer: 59 | Admitting: Family Medicine

## 2017-03-09 VITALS — BP 120/60 | HR 77 | Temp 98.1°F | Resp 12 | Ht 64.0 in | Wt 194.4 lb

## 2017-03-09 DIAGNOSIS — J029 Acute pharyngitis, unspecified: Secondary | ICD-10-CM | POA: Diagnosis not present

## 2017-03-09 DIAGNOSIS — R05 Cough: Secondary | ICD-10-CM | POA: Diagnosis not present

## 2017-03-09 DIAGNOSIS — J45901 Unspecified asthma with (acute) exacerbation: Secondary | ICD-10-CM

## 2017-03-09 DIAGNOSIS — R059 Cough, unspecified: Secondary | ICD-10-CM

## 2017-03-09 DIAGNOSIS — J069 Acute upper respiratory infection, unspecified: Secondary | ICD-10-CM

## 2017-03-09 LAB — POCT RAPID STREP A (OFFICE): RAPID STREP A SCREEN: NEGATIVE

## 2017-03-09 MED ORDER — PREDNISONE 20 MG PO TABS: 40.0000 mg | ORAL_TABLET | Freq: Every day | ORAL | 0 refills | Status: AC

## 2017-03-09 MED ORDER — HYDROCODONE-HOMATROPINE 5-1.5 MG/5ML PO SYRP: 5.0000 mL | ORAL_SOLUTION | Freq: Two times a day (BID) | ORAL | 0 refills | Status: AC | PRN

## 2017-03-09 MED FILL — predniSONE 20 MG TABS: 20 | 5 days supply | Qty: 10 | Fill #0

## 2017-03-09 NOTE — Progress Notes (Signed)
Pre visit review using our clinic review tool, if applicable. No additional management support is needed unless otherwise documented below in the visit note. 

## 2017-03-09 NOTE — Patient Instructions (Signed)
  [name] I have seen you today for an acute visit.  A few things to remember from today's visit:   Sore throat - Plan: POC Rapid Strep A  Mild asthma with exacerbation, unspecified whether persistent - Plan: predniSONE (DELTASONE) 20 MG tablet  Acute bronchitis, unspecified organism - Plan: HYDROcodone-homatropine (HYCODAN) 5-1.5 MG/5ML syrup      Albuterol inh 2 puff every 6 hours for a week then as needed for wheezing or shortness of breath.     Medications prescribed today are intended for short period of time and will not be refill upon request, a follow up appointment might be necessary to discuss continuation of of treatment if appropriate.     In general please monitor for signs of worsening symptoms and seek immediate medical attention if any concerning.  If symptoms are not resolved in 2 weeks you should schedule a follow up appointment with your doctor, before if needed.

## 2017-03-09 NOTE — Progress Notes (Signed)
HPI:  ACUTE VISIT  Chief Complaint  Patient presents with  . Sore Throat    [name] is a 66 y.o.female here today complaining of 2.5 weeks of sore throat. Burning throat sensation, intermittent, worse for the past 1-2 days. Frontal pressure headache and fatigue. Intermittent dysphonia, denies stridor. Symptoms seem to be worse at night and in the morning.   Sore Throat   This is a recurrent problem. The current episode started 1 to 4 weeks ago. The problem has been waxing and waning. Neither side of throat is experiencing more pain than the other. There has been no fever. The pain is moderate. Associated symptoms include congestion, coughing and a hoarse voice. Pertinent negatives include no abdominal pain, diarrhea, drooling, ear pain, headaches, neck pain, shortness of breath, stridor, swollen glands, trouble swallowing or vomiting. She has had no exposure to strep or mono. She has tried NSAIDs for the symptoms. The treatment provided moderate relief.    Non productive cough. Mild nasal congestion, rhinorrhea,or post nasal drainage. Denies fever or body aches. She has had some chills. She has not noted chest pain or dyspnea. + Wheezing ,worse at night. She uses Albuterol inh as needed.  No Hx of recent travel. No sick contact. No known insect bite.  Hx of allergies: Yes, asthma, resumed Symbicort a week ago and has helped some.  OTC medications for this problem: Took Advil this morning.   Symptoms otherwise stable.   Review of Systems  Constitutional: Positive for fatigue. Negative for activity change, appetite change and fever.  HENT: Positive for congestion, hoarse voice, postnasal drip, sore throat and voice change. Negative for drooling, ear pain, mouth sores, sinus pressure, sneezing and trouble swallowing.   Eyes: Negative for discharge, redness and itching.  Respiratory: Positive for cough and wheezing. Negative for shortness of breath and  stridor.   Cardiovascular: Negative for palpitations and leg swelling.  Gastrointestinal: Negative for abdominal pain, diarrhea, nausea and vomiting.  Musculoskeletal: Negative for myalgias and neck pain.  Skin: Negative for pallor and rash.  Allergic/Immunologic: Positive for environmental allergies.  Neurological: Negative for syncope, weakness and headaches.  Hematological: Negative for adenopathy. Does not bruise/bleed easily.  Psychiatric/Behavioral: Positive for sleep disturbance. Negative for confusion. The patient is nervous/anxious.       Current Outpatient Prescriptions on File Prior to Visit  Medication Sig Dispense Refill  . ALPRAZolam (XANAX) 0.5 MG tablet TAKE 1 TABLET BY MOUTH TWICE DAILY AS NEEDED 60 tablet 2  . budesonide-formoterol (SYMBICORT) 160-4.5 MCG/ACT inhaler Inhale 2 puffs into the lungs 2 (two) times daily. 1 Inhaler 3  . CARTIA XT 180 MG 24 hr capsule TAKE 1 CAPSULE BY MOUTH ONCE DAILY 30 capsule 5  . propranolol (INDERAL) 40 MG tablet TAKE 1 TABLET BY MOUTH 3 TIMES DAILY AS NEEDED 270 tablet 3  . SYNTHROID 112 MCG tablet TAKE 1 TABLET BY MOUTH ONCE DAILY 90 tablet 3  . VENTOLIN HFA 108 (90 Base) MCG/ACT inhaler INHALE 2 PUFSS BY MOUTH EVERY 6 HOURS AS NEEDED FOR WHEEZING 18 g 4   No current facility-administered medications on file prior to visit.      Past Medical History:  Diagnosis Date  . ASTHMA 04/26/2007  . CORONARY ARTERY SPASM 04/26/2007  . HYPOTHYROIDISM 02/07/2010  . NECK PAIN 02/01/2008  . SUPRAVENTRICULAR TACHYCARDIA, HX OF 04/26/2007   No Known Allergies  Social History   Social History  . Marital status: Married    Spouse name: N/A  .  Number of children: N/A  . Years of education: N/A   Social History Main Topics  . Smoking status: Never Smoker  . Smokeless tobacco: Never Used  . Alcohol use 1.8 oz/week    3 Glasses of wine per week  . Drug use: No  . Sexual activity: Not Asked   Other Topics Concern  . None   Social History  Narrative  . None    Vitals:   03/09/17 1625  BP: 120/60  Pulse: 77  Resp: 12  Temp: 98.1 F (36.7 C)  O2 sat 98% at RA. Body mass index is 33.36 kg/m.   Physical Exam  Nursing note and vitals reviewed. Constitutional: She is oriented to person, place, and time. She appears well-developed. She does not appear ill. No distress.  HENT:  Head: Atraumatic.  Right Ear: Tympanic membrane, external ear and ear canal normal.  Left Ear: Tympanic membrane, external ear and ear canal normal.  Nose: Rhinorrhea present. Right sinus exhibits no maxillary sinus tenderness and no frontal sinus tenderness. Left sinus exhibits no maxillary sinus tenderness and no frontal sinus tenderness.  Mouth/Throat: Uvula is midline and mucous membranes are normal. Posterior oropharyngeal erythema (mild) present.  Mild dysphonia. Post nasal drainage.  Eyes: Conjunctivae and EOM are normal.  Neck: No muscular tenderness present. No edema and no erythema present.  Cardiovascular: Normal rate and regular rhythm.   No murmur heard. Respiratory: Effort normal and breath sounds normal. No stridor. No respiratory distress. She has no wheezes. She has no rhonchi.  Prolonged expiration.  Lymphadenopathy:       Head (right side): No submandibular adenopathy present.       Head (left side): No submandibular adenopathy present.    She has no cervical adenopathy.  Neurological: She is alert and oriented to person, place, and time. She has normal strength.  Skin: Skin is warm. No rash noted. No erythema.  Psychiatric: Her mood appears anxious.  Well groomed, good eye contact.    ASSESSMENT AND PLAN:   [name] was seen today for sore throat.  Diagnoses and all orders for this visit:  Mild asthma with exacerbation, unspecified whether persistent  No wheezing appreciated on auscultation today. Albuterol inh 2 puff every 6 hours for a week then as needed for wheezing or shortness of breath.  Continue Symbicort  inh. Prednisone side effects discussed. Instructed about warning signs.  -     predniSONE (DELTASONE) 20 MG tablet; Take 2 tablets (40 mg total) by mouth daily with breakfast.  Sore throat  Possible etiologies discussed: allergic,viral, Symbicort among some. Rapid strep today negative, clinical findings do not suggest bacterial infection. Prednisone may help. Instructed about warning signs.  -     POC Rapid Strep A  Upper respiratory tract infection, unspecified type  It seems like acute episode has resolved and now with residual symptoms. CXR was offered even thought lung auscultation negative for rales, she does not think it is necessary. Monitor for new symptoms and/or fever.  Cough  Explained cough can last a few weeks. Albuterol inh may help. She can take Hydrocodone at bedtime if needed. Some side effects discussed.  -     HYDROcodone-homatropine (HYCODAN) 5-1.5 MG/5ML syrup; Take 5 mLs by mouth every 12 (twelve) hours as needed for cough.    -[name] was advised to return or notify a doctor immediately if symptoms worsen or persist or new concerns arise.       Betty G. Martinique, MD  [facility]. [place] office.

## 2017-03-10 MED FILL — HYDROCODONE-HOMATROPINE SYR: 5-1.5 | 10 days supply | Qty: 100 | Fill #0

## 2017-04-06 MED FILL — CARTIA XT 180 MG CAPSULE SA: 180 | 30 days supply | Qty: 30 | Fill #3

## 2017-05-05 MED FILL — CARTIA XT 180 MG CAPSULE SA: 180 | 30 days supply | Qty: 30 | Fill #4

## 2017-06-08 MED FILL — CARTIA XT 180 MG CAPSULE SA: 180 | 30 days supply | Qty: 30 | Fill #5

## 2017-06-08 MED FILL — SYNTHROID 112 MCG TABLET: 112 | 90 days supply | Qty: 90 | Fill #1

## 2017-06-30 ENCOUNTER — Telehealth: Payer: Self-pay | Admitting: Internal Medicine

## 2017-06-30 MED ORDER — DILTIAZEM HCL ER COATED BEADS 180 MG PO CP24: 180.0000 mg | ORAL_CAPSULE | Freq: Every day | ORAL | 2 refills | Status: DC

## 2017-06-30 NOTE — Telephone Encounter (Signed)
°  Pt has an appt next week and is requesting a refill   Pt request refill of the following:  CARTIA XT 180 MG 24 hr capsule   Phamacy: [facility]

## 2017-06-30 NOTE — Telephone Encounter (Signed)
Medication was refilled.

## 2017-07-02 MED FILL — ALPRAZolam 0.5 MG TABS: 0.5 | 30 days supply | Qty: 60 | Fill #1

## 2017-07-03 MED FILL — CARTIA XT 180 MG CAPSULE SA: 180 | 90 days supply | Qty: 90 | Fill #0

## 2017-07-13 ENCOUNTER — Encounter: Payer: Self-pay | Admitting: Internal Medicine

## 2017-07-13 ENCOUNTER — Ambulatory Visit (INDEPENDENT_AMBULATORY_CARE_PROVIDER_SITE_OTHER): Payer: 59 | Admitting: Internal Medicine

## 2017-07-13 ENCOUNTER — Other Ambulatory Visit: Payer: 59

## 2017-07-13 VITALS — BP 118/68 | HR 75 | Temp 98.1°F | Ht 64.0 in | Wt 195.2 lb

## 2017-07-13 DIAGNOSIS — Z0000 Encounter for general adult medical examination without abnormal findings: Secondary | ICD-10-CM

## 2017-07-13 DIAGNOSIS — E039 Hypothyroidism, unspecified: Secondary | ICD-10-CM

## 2017-07-13 DIAGNOSIS — D729 Disorder of white blood cells, unspecified: Secondary | ICD-10-CM | POA: Diagnosis not present

## 2017-07-13 DIAGNOSIS — E785 Hyperlipidemia, unspecified: Secondary | ICD-10-CM

## 2017-07-13 DIAGNOSIS — I471 Supraventricular tachycardia: Secondary | ICD-10-CM

## 2017-07-13 DIAGNOSIS — Z23 Encounter for immunization: Secondary | ICD-10-CM

## 2017-07-13 DIAGNOSIS — J4520 Mild intermittent asthma, uncomplicated: Secondary | ICD-10-CM | POA: Diagnosis not present

## 2017-07-13 DIAGNOSIS — R5383 Other fatigue: Secondary | ICD-10-CM | POA: Diagnosis not present

## 2017-07-13 LAB — COMPREHENSIVE METABOLIC PANEL
ALT: 49 U/L — ABNORMAL HIGH (ref 0–35)
AST: 49 U/L — ABNORMAL HIGH (ref 0–37)
Albumin: 4.4 g/dL (ref 3.5–5.2)
Alkaline Phosphatase: 105 U/L (ref 39–117)
BUN: 20 mg/dL (ref 6–23)
CHLORIDE: 105 meq/L (ref 96–112)
CO2: 25 mEq/L (ref 19–32)
Calcium: 9.6 mg/dL (ref 8.4–10.5)
Creatinine, Ser: 0.85 mg/dL (ref 0.40–1.20)
GFR: 71.35 mL/min (ref >60.00)
GLUCOSE: 100 mg/dL — AB (ref 70–99)
POTASSIUM: 4.4 meq/L (ref 3.5–5.1)
SODIUM: 139 meq/L (ref 135–145)
Total Bilirubin: 1.2 mg/dL (ref 0.2–1.2)
Total Protein: 7.1 g/dL (ref 6.0–8.3)

## 2017-07-13 LAB — CBC WITH DIFFERENTIAL/PLATELET
BASOS PCT: 0.6 % (ref 0.0–3.0)
Basophils Absolute: 0 10*3/uL (ref 0.0–0.1)
EOS PCT: 9.8 % — AB (ref 0.0–5.0)
Eosinophils Absolute: 0.6 10*3/uL (ref 0.0–0.7)
HCT: 34.6 % — ABNORMAL LOW (ref 36.0–46.0)
Hemoglobin: 11.8 g/dL — ABNORMAL LOW (ref 12.0–15.0)
LYMPHS ABS: 1.8 10*3/uL (ref 0.7–4.0)
Lymphocytes Relative: 31.2 % (ref 12.0–46.0)
MCHC: 34 g/dL (ref 30.0–36.0)
MCV: 126 fl — ABNORMAL HIGH (ref 78.0–100.0)
MONO ABS: 0.5 10*3/uL (ref 0.1–1.0)
Monocytes Relative: 8.3 % (ref 3.0–12.0)
NEUTROS ABS: 2.9 10*3/uL (ref 1.4–7.7)
NEUTROS PCT: 50.1 % (ref 43.0–77.0)
Platelets: 215 10*3/uL (ref 150.0–400.0)
RBC: 2.75 Mil/uL — ABNORMAL LOW (ref 3.87–5.11)
RDW: 18.5 % — AB (ref 11.5–15.5)
WBC: 5.7 10*3/uL (ref 4.0–10.5)

## 2017-07-13 LAB — LIPID PANEL
Cholesterol: 183 mg/dL (ref 0–200)
HDL: 42.7 mg/dL (ref >39.00)
NONHDL: 139.85
Total CHOL/HDL Ratio: 4
Triglycerides: 246 mg/dL — ABNORMAL HIGH (ref 0.0–149.0)
VLDL: 49.2 mg/dL — AB (ref 0.0–40.0)

## 2017-07-13 LAB — LDL CHOLESTEROL, DIRECT: LDL DIRECT: 100 mg/dL

## 2017-07-13 LAB — TSH: TSH: 1.88 u[IU]/mL (ref 0.35–4.50)

## 2017-07-13 MED ORDER — ALBUTEROL SULFATE HFA 108 (90 BASE) MCG/ACT IN AERS: INHALATION_SPRAY | RESPIRATORY_TRACT | 4 refills | Status: DC

## 2017-07-13 MED ORDER — PROPRANOLOL HCL 40 MG PO TABS: 40.0000 mg | ORAL_TABLET | Freq: Three times a day (TID) | ORAL | 3 refills | Status: DC | PRN

## 2017-07-13 MED ORDER — BUDESONIDE-FORMOTEROL FUMARATE 160-4.5 MCG/ACT IN AERO: 2.0000 | INHALATION_SPRAY | Freq: Two times a day (BID) | RESPIRATORY_TRACT | 3 refills | Status: DC

## 2017-07-13 MED ORDER — DILTIAZEM HCL ER COATED BEADS 180 MG PO CP24: 180.0000 mg | ORAL_CAPSULE | Freq: Every day | ORAL | 2 refills | Status: DC

## 2017-07-13 MED ORDER — ALPRAZOLAM 0.5 MG PO TABS: 0.5000 mg | ORAL_TABLET | Freq: Two times a day (BID) | ORAL | 2 refills | Status: DC | PRN

## 2017-07-13 MED ORDER — LEVOTHYROXINE SODIUM 112 MCG PO TABS: 112.0000 ug | ORAL_TABLET | Freq: Every day | ORAL | 3 refills | Status: DC

## 2017-07-13 MED FILL — PROPRANOLOL 40 MG TABLET: 40 | 90 days supply | Qty: 270 | Fill #0

## 2017-07-13 MED FILL — VENTOLIN HFA 90 MCG INHALER: 108 (90 BAS | 25 days supply | Qty: 18 | Fill #0

## 2017-07-13 NOTE — Progress Notes (Addendum)
Subjective:    Patient ID: Kristin Leonard, female    DOB: [birth year], 66 y.o.   MRN: [id number]  HPI  66 year old patient who is seen today in follow-up.  She has history of asthma, which has been fairly stable. She is on maintenance medication.  Throughout most of the year, but is able to have drug holidays through the fall and winter. She has a history of PSVT and does require propranolol infrequently.  She is maintained on diltiazem. No concerns or complaints She is seen by OB/GYN annually with annual mammograms  Overdue for follow-up colonoscopy  She has hypothyroidism and no recent lab  Past Medical History:  Diagnosis Date  . ASTHMA 04/26/2007  . CORONARY ARTERY SPASM 04/26/2007  . HYPOTHYROIDISM 02/07/2010  . NECK PAIN 02/01/2008  . SUPRAVENTRICULAR TACHYCARDIA, HX OF 04/26/2007     Social History   Social History  . Marital status: Married    Spouse name: N/A  . Number of children: N/A  . Years of education: N/A   Occupational History  . Not on file.   Social History Main Topics  . Smoking status: Never Smoker  . Smokeless tobacco: Never Used  . Alcohol use 1.8 oz/week    3 Glasses of wine per week  . Drug use: No  . Sexual activity: Not on file   Other Topics Concern  . Not on file   Social History Narrative  . No narrative on file    Past Surgical History:  Procedure Laterality Date  . BREAST SURGERY     biopsy  . LASER ABLATION     x2  . TUBAL LIGATION      No family history on file.  No Known Allergies  No current outpatient prescriptions on file prior to visit.   No current facility-administered medications on file prior to visit.     BP 118/68 (BP Location: Left Arm, Patient Position: Sitting, Cuff Size: Normal)   Pulse 75   Temp 98.1 F (36.7 C) (Oral)   Ht 5\' 4"  (1.626 m)   Wt 195 lb 3.2 oz (88.5 kg)   SpO2 95%   BMI 33.51 kg/m     Review of Systems  Constitutional: Negative.   HENT: Negative for congestion, dental  problem, hearing loss, rhinorrhea, sinus pressure, sore throat and tinnitus.   Eyes: Negative for pain, discharge and visual disturbance.  Respiratory: Negative for cough and shortness of breath.   Cardiovascular: Positive for palpitations. Negative for chest pain and leg swelling.  Gastrointestinal: Negative for abdominal distention, abdominal pain, blood in stool, constipation, diarrhea, nausea and vomiting.  Genitourinary: Negative for difficulty urinating, dysuria, flank pain, frequency, hematuria, pelvic pain, urgency, vaginal bleeding, vaginal discharge and vaginal pain.  Musculoskeletal: Negative for arthralgias, gait problem and joint swelling.  Skin: Negative for rash.  Neurological: Negative for dizziness, syncope, speech difficulty, weakness, numbness and headaches.  Hematological: Negative for adenopathy.  Psychiatric/Behavioral: Negative for agitation, behavioral problems and dysphoric mood. The patient is not nervous/anxious.        Objective:   Physical Exam  Constitutional: She is oriented to person, place, and time. She appears well-developed and well-nourished.  Deeply tanned Blood pressure low normal  overweight  HENT:  Head: Normocephalic.  Right Ear: External ear normal.  Left Ear: External ear normal.  Mouth/Throat: Oropharynx is clear and moist.  Pharyngeal crowding  Eyes: Pupils are equal, round, and reactive to light. Conjunctivae and EOM are normal.  Neck: Normal range of motion. Neck  supple. No thyromegaly present.  Cardiovascular: Normal rate, regular rhythm, normal heart sounds and intact distal pulses.   Pulmonary/Chest: Effort normal and breath sounds normal. No respiratory distress. She has no wheezes. She has no rales.  Abdominal: Soft. Bowel sounds are normal. She exhibits no mass. There is no tenderness.  Musculoskeletal: Normal range of motion.  Lymphadenopathy:    She has no cervical adenopathy.  Neurological: She is alert and oriented to  person, place, and time.  Skin: Skin is warm and dry. No rash noted.  Psychiatric: She has a normal mood and affect. Her behavior is normal.          Assessment & Plan:   History of asthma, stable Hypothyroidism.  We'll check TSH Preventative health.  Will schedule follow-up colonoscopy PSVT.  Stable  Follow-up OB/GYN CPX one year Check updated lab today  Nyoka Cowden

## 2017-07-13 NOTE — Patient Instructions (Addendum)
Limit your sodium (Salt) intake    It is important that you exercise regularly, at least 20 minutes 3 to 4 times per week.  If you develop chest pain or shortness of breath seek  medical attention.  Return in one year for follow-up  Gynecology follow-up as scheduled  Schedule your colonoscopy to help detect colon cancer.

## 2017-07-13 NOTE — Addendum Note (Signed)
Addended by: Tomi Likens on: 07/13/2017 11:08 AM   Modules accepted: Orders

## 2017-07-13 NOTE — Addendum Note (Signed)
Addended by: Abelardo Diesel on: 07/13/2017 11:32 AM   Modules accepted: Orders

## 2017-07-14 ENCOUNTER — Other Ambulatory Visit (INDEPENDENT_AMBULATORY_CARE_PROVIDER_SITE_OTHER): Payer: 59

## 2017-07-14 DIAGNOSIS — R5383 Other fatigue: Secondary | ICD-10-CM

## 2017-07-14 LAB — HEPATITIS C ANTIBODY
Hepatitis C Ab: NONREACTIVE
SIGNAL TO CUT-OFF: 0.01 (ref <1.00)

## 2017-07-14 LAB — PATHOLOGIST SMEAR REVIEW

## 2017-07-14 LAB — VITAMIN B12: VITAMIN B 12: 87 pg/mL — AB (ref 211–911)

## 2017-07-14 NOTE — Addendum Note (Signed)
Addended by: Abelardo Diesel on: 07/14/2017 12:54 PM   Modules accepted: Orders

## 2017-07-14 NOTE — Addendum Note (Signed)
Addended by: Tomi Likens on: 07/14/2017 01:41 PM   Modules accepted: Orders

## 2017-07-15 ENCOUNTER — Ambulatory Visit (INDEPENDENT_AMBULATORY_CARE_PROVIDER_SITE_OTHER): Payer: 59

## 2017-07-15 DIAGNOSIS — E538 Deficiency of other specified B group vitamins: Secondary | ICD-10-CM

## 2017-07-15 MED ORDER — CYANOCOBALAMIN 1000 MCG/ML IJ SOLN
1000.0000 ug | Freq: Once | INTRAMUSCULAR | Status: AC
  Administered 2017-07-15: 1000 ug via INTRAMUSCULAR

## 2017-07-22 ENCOUNTER — Telehealth: Payer: Self-pay | Admitting: Pediatrics

## 2017-07-22 ENCOUNTER — Ambulatory Visit (INDEPENDENT_AMBULATORY_CARE_PROVIDER_SITE_OTHER): Payer: 59 | Admitting: *Deleted

## 2017-07-22 DIAGNOSIS — E538 Deficiency of other specified B group vitamins: Secondary | ICD-10-CM | POA: Diagnosis not present

## 2017-07-22 MED ORDER — CYANOCOBALAMIN 1000 MCG/ML IJ SOLN: 1000.0000 ug | INTRAMUSCULAR | 5 refills | Status: DC

## 2017-07-22 MED ORDER — CYANOCOBALAMIN 1000 MCG/ML IJ SOLN
1000.0000 ug | Freq: Once | INTRAMUSCULAR | Status: AC
  Administered 2017-07-22: 1000 ug via INTRAMUSCULAR

## 2017-07-22 NOTE — Telephone Encounter (Signed)
Pt states she wishes to self administer B12 injections from this point forward.  She requests you send rx to [facility].    Order per 07/14/17 result note-1 Q wk x4, then Q month  She has rec'd 2 weekly injections thus far.

## 2017-07-22 NOTE — Telephone Encounter (Signed)
Okay.  Please call in appropriate prescriptions

## 2017-07-23 ENCOUNTER — Encounter: Payer: Self-pay | Admitting: Internal Medicine

## 2017-07-23 MED FILL — CYANOCOBALAMIN 1,000 MCG/ML: 1000 | 60 days supply | Qty: 3 | Fill #0

## 2017-08-10 IMAGING — DX DG CHEST 2V
2 series · 2 of 2 positions shown · non-contrast
Comparison: None.

CLINICAL DATA: Dry cough for 5 weeks, history of asthma

EXAM:
CHEST  2 VIEW

[chest pa]
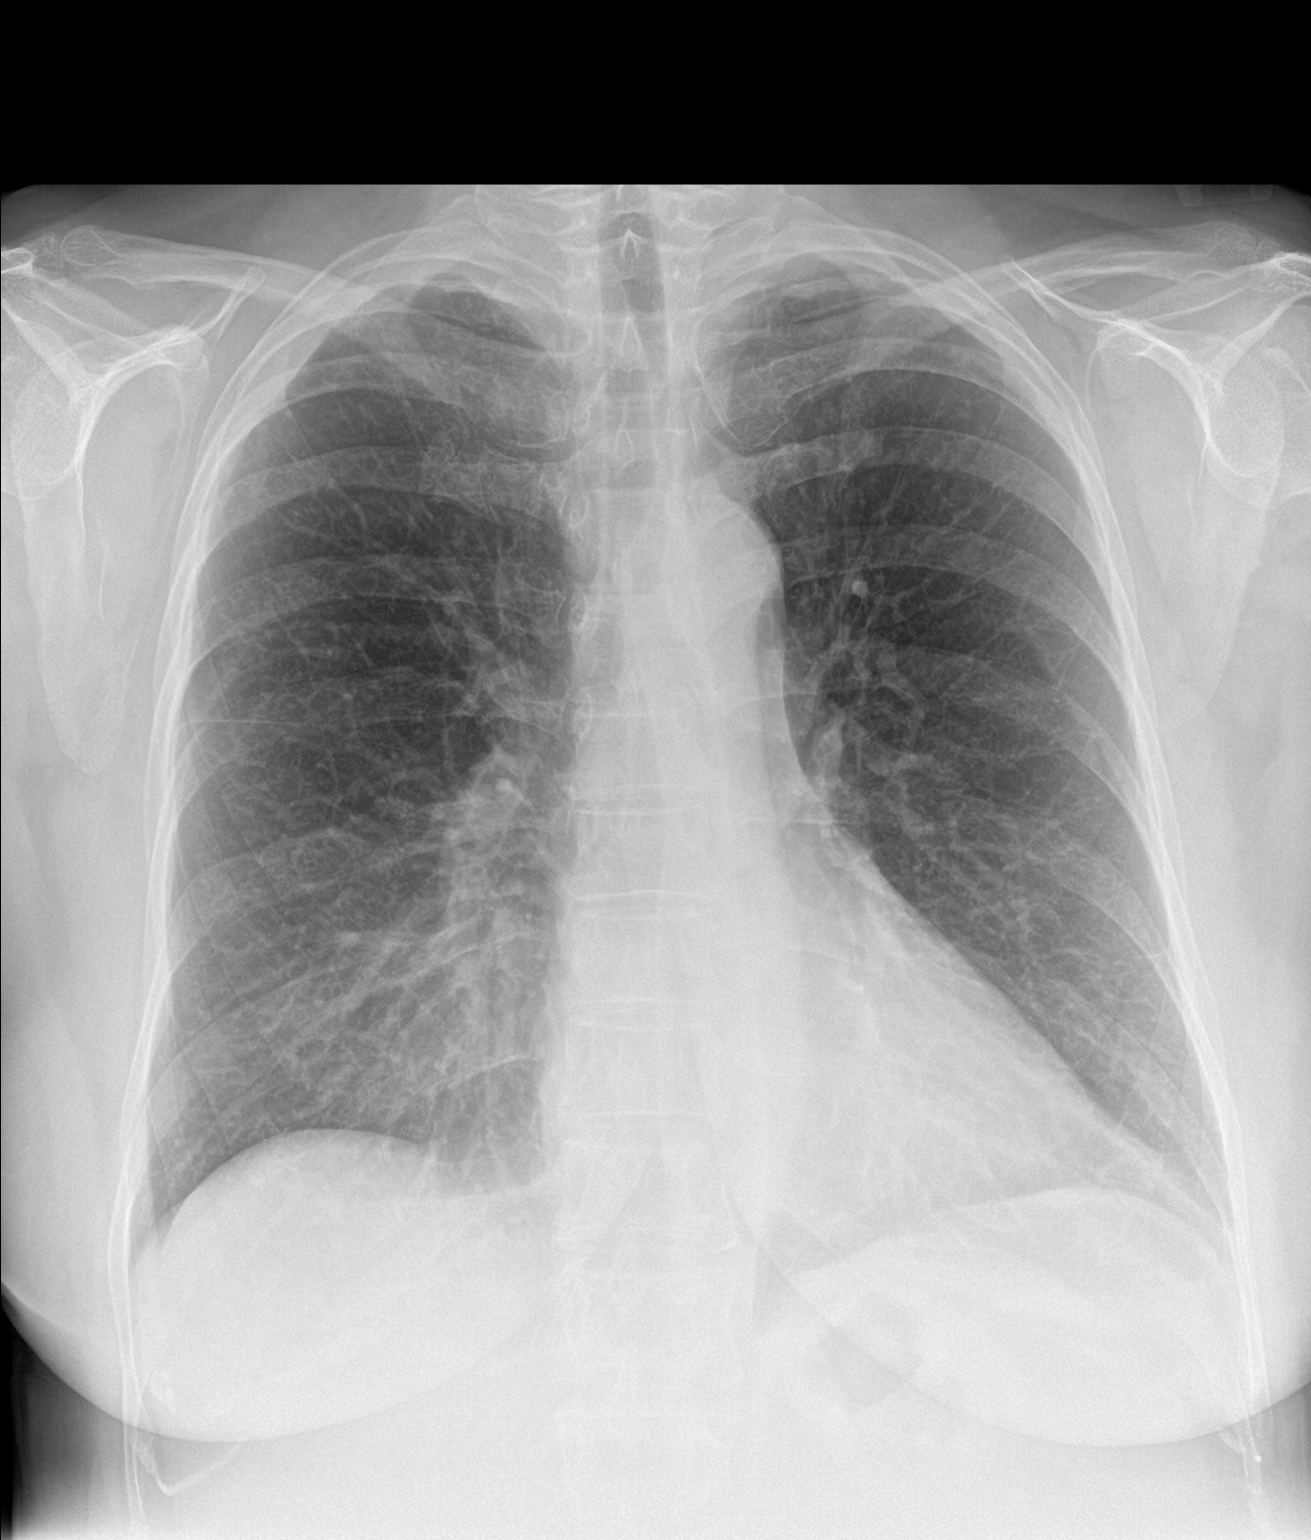

[chest lat]
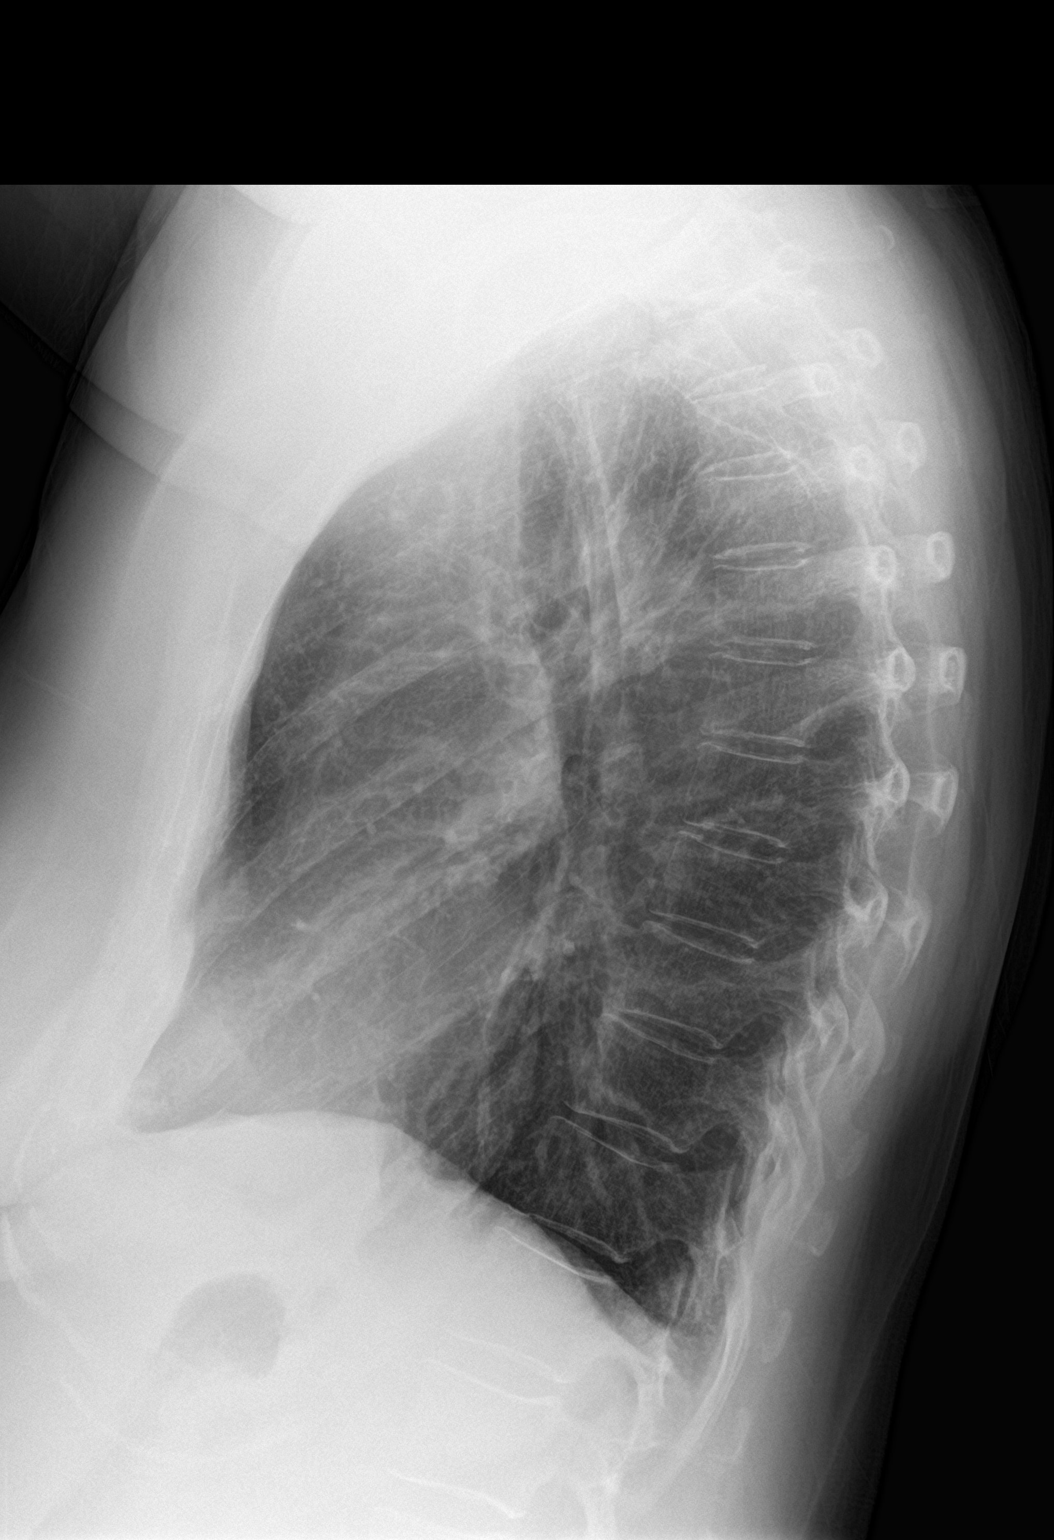

[2 of 2 positions shown; findings below may reference images not displayed]

FINDINGS: Cardiomediastinal silhouette is unremarkable. No acute infiltrate or
pleural effusion. No pulmonary edema. Bony thorax is unremarkable.
Minimal perihilar increased bronchial marks the without focal
consolidation.
IMPRESSION: No infiltrate or pulmonary edema. Minimal perihilar increased
bronchial markings without focal consolidation.

## 2017-08-12 ENCOUNTER — Encounter: Payer: Self-pay | Admitting: Internal Medicine

## 2017-09-07 MED FILL — SYNTHROID 112 MCG TABLET: 112 | 90 days supply | Qty: 90 | Fill #2

## 2017-09-09 MED FILL — CYANOCOBALAMIN 1,000 MCG/ML: 1000 | 84 days supply | Qty: 3 | Fill #1

## 2017-10-07 MED FILL — CARTIA XT 180 MG CAPSULE SA: 180 | 90 days supply | Qty: 90 | Fill #1

## 2017-10-08 DIAGNOSIS — H524 Presbyopia: Secondary | ICD-10-CM | POA: Diagnosis not present

## 2017-10-09 MED FILL — LOTEMAX 0.5% EYE DROPS: 0.5 | 13 days supply | Qty: 5 | Fill #0

## 2017-11-02 ENCOUNTER — Other Ambulatory Visit: Payer: Self-pay | Admitting: Internal Medicine

## 2017-11-02 MED FILL — ALPRAZolam 0.5 MG TABS: 0.5 | 30 days supply | Qty: 60 | Fill #0

## 2017-11-02 MED FILL — VENTOLIN HFA 90 MCG INHALER: 108 (90 BAS | 25 days supply | Qty: 18 | Fill #1

## 2017-11-02 NOTE — Telephone Encounter (Signed)
Okay for refill?  

## 2017-12-07 MED FILL — SYNTHROID 112 MCG TABLET: 112 | 90 days supply | Qty: 90 | Fill #3

## 2017-12-07 MED FILL — CYANOCOBALAMIN 1,000 MCG/ML: 1000 | 84 days supply | Qty: 3 | Fill #2

## 2017-12-22 DIAGNOSIS — Z01419 Encounter for gynecological examination (general) (routine) without abnormal findings: Secondary | ICD-10-CM | POA: Diagnosis not present

## 2017-12-22 DIAGNOSIS — Z1231 Encounter for screening mammogram for malignant neoplasm of breast: Secondary | ICD-10-CM | POA: Diagnosis not present

## 2017-12-22 DIAGNOSIS — Z6833 Body mass index (BMI) 33.0-33.9, adult: Secondary | ICD-10-CM | POA: Diagnosis not present

## 2017-12-22 DIAGNOSIS — D519 Vitamin B12 deficiency anemia, unspecified: Secondary | ICD-10-CM | POA: Diagnosis not present

## 2017-12-25 DIAGNOSIS — R102 Pelvic and perineal pain: Secondary | ICD-10-CM | POA: Diagnosis not present

## 2017-12-25 DIAGNOSIS — Z13820 Encounter for screening for osteoporosis: Secondary | ICD-10-CM | POA: Diagnosis not present

## 2017-12-25 DIAGNOSIS — M545 Low back pain: Secondary | ICD-10-CM | POA: Diagnosis not present

## 2017-12-30 ENCOUNTER — Encounter: Payer: Self-pay | Admitting: Family Medicine

## 2017-12-30 ENCOUNTER — Ambulatory Visit: Payer: 59 | Admitting: Family Medicine

## 2017-12-30 VITALS — BP 108/68 | HR 80 | Temp 98.5°F | Resp 16 | Ht 64.0 in | Wt 194.0 lb

## 2017-12-30 DIAGNOSIS — R51 Headache: Secondary | ICD-10-CM

## 2017-12-30 DIAGNOSIS — R0981 Nasal congestion: Secondary | ICD-10-CM

## 2017-12-30 DIAGNOSIS — R519 Headache, unspecified: Secondary | ICD-10-CM

## 2017-12-30 DIAGNOSIS — J029 Acute pharyngitis, unspecified: Secondary | ICD-10-CM

## 2017-12-30 DIAGNOSIS — J101 Influenza due to other identified influenza virus with other respiratory manifestations: Secondary | ICD-10-CM

## 2017-12-30 LAB — POC INFLUENZA A&B (BINAX/QUICKVUE)
Influenza A, POC: POSITIVE — AB
Influenza B, POC: NEGATIVE

## 2017-12-30 LAB — POCT RAPID STREP A (OFFICE): Rapid Strep A Screen: NEGATIVE

## 2017-12-30 MED ORDER — PREDNISONE 20 MG PO TABS: 40.0000 mg | ORAL_TABLET | Freq: Every day | ORAL | 0 refills | Status: AC

## 2017-12-30 MED ORDER — OSELTAMIVIR PHOSPHATE 75 MG PO CAPS: 75.0000 mg | ORAL_CAPSULE | Freq: Two times a day (BID) | ORAL | 0 refills | Status: AC

## 2017-12-30 MED FILL — OSELTAMIVIR PHOSPHATE 75 MG: 75 | 5 days supply | Qty: 10 | Fill #0

## 2017-12-30 MED FILL — predniSONE 20 MG TABS: 20 | 3 days supply | Qty: 6 | Fill #0

## 2017-12-30 NOTE — Patient Instructions (Addendum)
  [name] I have seen you today for an acute visit.  A few things to remember from today's visit:   Sore throat - Plan: POCT rapid strep A, Culture, Group A Strep  Frontal headache  Nasal sinus congestion - Plan: predniSONE (DELTASONE) 20 MG tablet  Influenza A - Plan: oseltamivir (TAMIFLU) 75 MG capsule  viral infections are self-limited and we treat each symptom depending of severity.  Over the counter medications as decongestants and cold medications usually help, they need to be taken with caution if there is a history of high blood pressure or palpitations. Tylenol and/or Ibuprofen also helps with most symptoms (headache, muscle aching, fever,etc) Plenty of fluids. Honey helps with cough. Steam inhalations helps with runny nose, nasal congestion, and may prevent sinus infections. Cough and nasal congestion could last a few days and sometimes weeks. Please follow in not any better in 1-2 weeks or if symptoms get worse.  Medications prescribed today are intended for short period of time and will not be refill upon request, a follow up appointment might be necessary to discuss continuation of of treatment if appropriate.   Albuterol inh 2 puff every 6 hours for a week then as needed for wheezing or shortness of breath.  Prednisone for 3 days may help with sinus congestion.   In general please monitor for signs of worsening symptoms and seek immediate medical attention if any concerning.    I hope you get better soon!

## 2017-12-30 NOTE — Progress Notes (Signed)
ACUTE VISIT  HPI:  Chief Complaint  Patient presents with  . Sore Throat    started 2 days ago  . Nasal Congestion  . Cough  . Headache    [name] is a 67 y.o.female here today complaining of 2 days of respiratory symptoms. Yesterday she started with severe constant, frontal headache,throbbing, 10/10. Today headache is 6/10.  Ibuprofen and Aleve helps a little with headache. She denies history of headaches. She denies associated visual changes, nausea, vomiting, or focal deficit.  Headache is alleviated by placing a warm cloth on frontal area.  Headache   This is a new problem. The current episode started yesterday. The problem has been rapidly improving. The pain is located in the frontal region. The pain does not radiate. The pain quality is not similar to prior headaches. The quality of the pain is described as throbbing. The pain is at a severity of 10/10. The pain is severe. Associated symptoms include back pain (Lower back pain ,chronic), coughing, drainage, muscle aches, rhinorrhea, sinus pressure and a sore throat. Pertinent negatives include no abdominal pain, abnormal behavior, blurred vision, dizziness, ear pain, eye redness, fever, hearing loss, loss of balance, nausea, neck pain, numbness, photophobia, swollen glands, tingling, tinnitus, visual change, vomiting or weakness. The symptoms are aggravated by coughing. She has tried NSAIDs for the symptoms. The treatment provided mild relief. There is no history of migraine headaches or recent head traumas.  URI   This is a new problem. The current episode started in the past 7 days. The problem has been unchanged. There has been no fever. Associated symptoms include congestion, coughing, headaches, rhinorrhea, a sore throat and wheezing. Pertinent negatives include no abdominal pain, diarrhea, ear pain, nausea, neck pain, rash, swollen glands or vomiting. She has tried decongestant for the symptoms. The  treatment provided mild relief.    Nonproductive cough. Occasionally she hears some wheezing at the end of coughing spells, she has history of asthma.  She has not used her albuterol inhaler lately.   No Hx of recent travel. + Sick contact, her husband was sick about 2 weeks ago. No known insect bite.  Hx of allergies: Yes  OTC medications for this problem: Ibuprofen, Aleve,, and OTC cold medications.    Review of Systems  Constitutional: Positive for activity change, chills and fatigue. Negative for appetite change and fever.  HENT: Positive for congestion, postnasal drip, rhinorrhea, sinus pressure and sore throat. Negative for ear pain, hearing loss, mouth sores, tinnitus, trouble swallowing and voice change.   Eyes: Negative for blurred vision, photophobia, redness and visual disturbance.  Respiratory: Positive for cough and wheezing. Negative for shortness of breath.   Gastrointestinal: Negative for abdominal pain, diarrhea, nausea and vomiting.  Musculoskeletal: Positive for back pain (Lower back pain ,chronic) and myalgias. Negative for neck pain.  Skin: Negative for rash.  Allergic/Immunologic: Positive for environmental allergies.  Neurological: Positive for headaches. Negative for dizziness, tingling, syncope, facial asymmetry, speech difficulty, weakness, numbness and loss of balance.  Hematological: Negative for adenopathy. Does not bruise/bleed easily.  Psychiatric/Behavioral: Negative for confusion. The patient is nervous/anxious.       Current Outpatient Medications on File Prior to Visit  Medication Sig Dispense Refill  . albuterol (VENTOLIN HFA) 108 (90 Base) MCG/ACT inhaler INHALE 2 PUFSS BY MOUTH EVERY 6 HOURS AS NEEDED FOR WHEEZING 18 g 4  . ALPRAZolam (XANAX) 0.5 MG tablet TAKE 1 TABLET BY MOUTH TWICE DAILY AS NEEDED 60  tablet 2  . budesonide-formoterol (SYMBICORT) 160-4.5 MCG/ACT inhaler Inhale 2 puffs into the lungs 2 (two) times daily. 1 Inhaler 3  .  cyanocobalamin (,VITAMIN B-12,) 1000 MCG/ML injection Inject 1 mL (1,000 mcg total) into the muscle as directed. Inject 1,000 mcg (1 mL) weekly for two weeks, then inject 1,000 mcg (1 mL) once monthly. 3 mL 5  . diltiazem (CARTIA XT) 180 MG 24 hr capsule Take 1 capsule (180 mg total) by mouth daily. 90 capsule 2  . levothyroxine (SYNTHROID) 112 MCG tablet Take 1 tablet (112 mcg total) by mouth daily. 90 tablet 3  . propranolol (INDERAL) 40 MG tablet Take 1 tablet (40 mg total) by mouth 3 (three) times daily as needed. 270 tablet 3   No current facility-administered medications on file prior to visit.      Past Medical History:  Diagnosis Date  . ASTHMA 04/26/2007  . CORONARY ARTERY SPASM 04/26/2007  . HYPOTHYROIDISM 02/07/2010  . NECK PAIN 02/01/2008  . SUPRAVENTRICULAR TACHYCARDIA, HX OF 04/26/2007   No Known Allergies  Social History   Socioeconomic History  . Marital status: Married    Spouse name: None  . Number of children: None  . Years of education: None  . Highest education level: None  Social Needs  . Financial resource strain: None  . Food insecurity - worry: None  . Food insecurity - inability: None  . Transportation needs - medical: None  . Transportation needs - non-medical: None  Occupational History  . None  Tobacco Use  . Smoking status: Never Smoker  . Smokeless tobacco: Never Used  Substance and Sexual Activity  . Alcohol use: Yes    Alcohol/week: 1.8 oz    Types: 3 Glasses of wine per week  . Drug use: No  . Sexual activity: None  Other Topics Concern  . None  Social History Narrative  . None    Vitals:   12/30/17 1148  BP: 108/68  Pulse: 80  Resp: 16  Temp: 98.5 F (36.9 C)  SpO2: 95%   Body mass index is 33.3 kg/m.   Physical Exam  Nursing note and vitals reviewed. Constitutional: She is oriented to person, place, and time. She appears well-developed. She does not appear ill. No distress.  HENT:  Head: Normocephalic and atraumatic.    Right Ear: Tympanic membrane, external ear and ear canal normal.  Left Ear: Tympanic membrane, external ear and ear canal normal.  Nose: Rhinorrhea present. Right sinus exhibits no maxillary sinus tenderness and no frontal sinus tenderness. Left sinus exhibits no maxillary sinus tenderness and no frontal sinus tenderness.  Mouth/Throat: Mucous membranes are normal. Posterior oropharyngeal erythema present. No oropharyngeal exudate or posterior oropharyngeal edema.  Hypertrophic turbinates. Postnasal drainage.  Eyes: Conjunctivae and EOM are normal. Pupils are equal, round, and reactive to light.  Neck: No muscular tenderness present. No edema and no erythema present. No Brudzinski's sign and no Kernig's sign noted.  Cardiovascular: Normal rate and regular rhythm.  No murmur heard. Respiratory: Effort normal and breath sounds normal. No stridor. No respiratory distress.  Musculoskeletal: She exhibits no edema or tenderness.  Lymphadenopathy:       Head (right side): No submandibular adenopathy present.       Head (left side): No submandibular adenopathy present.    She has no cervical adenopathy.  Neurological: She is alert and oriented to person, place, and time. She has normal strength. No cranial nerve deficit. Gait normal.  Skin: Skin is warm. No rash noted.  No erythema.  Psychiatric: Her mood appears anxious.  Well groomed, good eye contact.    ASSESSMENT AND PLAN:   Ms.Nashya was seen today for sore throat, nasal congestion, cough and headache.  Diagnoses and all orders for this visit:  Frontal headache  We discussed possible etiologies, neurologic examination negative today. Most likely related with viral illness. She is concerned about headache, so we discussed the option of having head imaging done today, she prefers to hold on head CT given the fact today headache is mildly better. She was clearly instructed about warning signs, she voices understanding.  -     POC  Influenza A&B (Binax test)  Sore throat -     POCT rapid strep A -     POC Influenza A&B (Binax test)  Nasal sinus congestion  This could be aggravating headache. She has taken Prednisone before and has tolerated it well, recommend 3 days course. Steam inhalations and nasal saline irrigations my also help. Some side effects of OTC decongestant discussed.  -     predniSONE (DELTASONE) 20 MG tablet; Take 2 tablets (40 mg total) by mouth daily with breakfast for 3 days. -     POC Influenza A&B (Binax test)  Influenza A  She will start Tamiflu. Rest and plenty of fluids. Because of history of asthma, I recommended Albuterol inh 2 puff every 6 hours for a week then as needed for wheezing or shortness of breath.   Instructed about warning signs. Follow-up as needed.   -     oseltamivir (TAMIFLU) 75 MG capsule; Take 1 capsule (75 mg total) by mouth 2 (two) times daily for 5 days.     -Ms. Tamie H Depaoli advised to seek attention immediately if symptoms worsen or to follow if they persist or new concerns arise.        Jeptha Hinnenkamp G. [place], MD  [facility]. [place] office.

## 2018-01-11 MED FILL — VENTOLIN HFA 90 MCG INHALER: 108 (90 BAS | 25 days supply | Qty: 18 | Fill #2

## 2018-01-11 MED FILL — CARTIA XT 180 MG CAPSULE SA: 180 | 90 days supply | Qty: 90 | Fill #2

## 2018-02-22 MED FILL — ALPRAZolam 0.5 MG TABS: 0.5 | 30 days supply | Qty: 60 | Fill #1

## 2018-03-09 MED FILL — SYNTHROID 112 MCG TABLET: 112 | 90 days supply | Qty: 90 | Fill #0

## 2018-03-09 MED FILL — CYANOCOBALAMIN 1,000 MCG/ML: 1000 | 84 days supply | Qty: 3 | Fill #3

## 2018-04-13 MED FILL — CARTIA XT 180 MG CAPSULE SA: 180 | 90 days supply | Qty: 90 | Fill #0

## 2018-04-13 MED FILL — ALPRAZolam 0.5 MG TABS: 0.5 | 30 days supply | Qty: 60 | Fill #2

## 2018-06-07 MED FILL — CYANOCOBALAMIN 1,000 MCG/ML: 1000 | 84 days supply | Qty: 3 | Fill #4

## 2018-06-07 MED FILL — SYNTHROID 112 MCG TABLET: 112 | 90 days supply | Qty: 90 | Fill #1

## 2018-06-15 DIAGNOSIS — L2089 Other atopic dermatitis: Secondary | ICD-10-CM | POA: Diagnosis not present

## 2018-06-15 DIAGNOSIS — L309 Dermatitis, unspecified: Secondary | ICD-10-CM | POA: Diagnosis not present

## 2018-06-15 DIAGNOSIS — L282 Other prurigo: Secondary | ICD-10-CM | POA: Diagnosis not present

## 2018-06-15 MED FILL — TRIAMCINOLONE 0.1% CREAM: 0.1 | 30 days supply | Qty: 60 | Fill #0

## 2018-06-29 ENCOUNTER — Ambulatory Visit: Payer: 59 | Admitting: Internal Medicine

## 2018-06-29 ENCOUNTER — Encounter: Payer: Self-pay | Admitting: Internal Medicine

## 2018-06-29 VITALS — BP 100/58 | HR 69 | Temp 98.1°F | Wt 189.0 lb

## 2018-06-29 DIAGNOSIS — M542 Cervicalgia: Secondary | ICD-10-CM | POA: Diagnosis not present

## 2018-06-29 MED ORDER — METHYLPREDNISOLONE ACETATE 80 MG/ML IJ SUSP
80.0000 mg | Freq: Once | INTRAMUSCULAR | Status: AC
  Administered 2018-06-29: 80 mg via INTRAMUSCULAR

## 2018-06-29 NOTE — Patient Instructions (Signed)
Cervical Strain and Sprain Rehab Ask your health care provider which exercises are safe for you. Do exercises exactly as told by your health care provider and adjust them as directed. It is normal to feel mild stretching, pulling, tightness, or discomfort as you do these exercises, but you should stop right away if you feel sudden pain or your pain gets worse.Do not begin these exercises until told by your health care provider. Stretching and range of motion exercises These exercises warm up your muscles and joints and improve the movement and flexibility of your neck. These exercises also help to relieve pain, numbness, and tingling. Exercise A: Cervical side bend  1. Using good posture, sit on a stable chair or stand up. 2. Without moving your shoulders, slowly tilt your left / right ear to your shoulder until you feel a stretch in your neck muscles. You should be looking straight ahead. 3. Hold for __________ seconds. 4. Repeat with the other side of your neck. Repeat __________ times. Complete this exercise __________ times a day. Exercise B: Cervical rotation  1. Using good posture, sit on a stable chair or stand up. 2. Slowly turn your head to the side as if you are looking over your left / right shoulder. ? Keep your eyes level with the ground. ? Stop when you feel a stretch along the side and the back of your neck. 3. Hold for __________ seconds. 4. Repeat this by turning to your other side. Repeat __________ times. Complete this exercise __________ times a day. Exercise C: Thoracic extension and pectoral stretch 1. Roll a towel or a small blanket so it is about 4 inches (10 cm) in diameter. 2. Lie down on your back on a firm surface. 3. Put the towel lengthwise, under your spine in the middle of your back. It should not be not under your shoulder blades. The towel should line up with your spine from your middle back to your lower back. 4. Put your hands behind your head and let your  elbows fall out to your sides. 5. Hold for __________ seconds. Repeat __________ times. Complete this exercise __________ times a day. Strengthening exercises These exercises build strength and endurance in your neck. Endurance is the ability to use your muscles for a long time, even after your muscles get tired. Exercise D: Upper cervical flexion, isometric 1. Lie on your back with a thin pillow behind your head and a small rolled-up towel under your neck. 2. Gently tuck your chin toward your chest and nod your head down to look toward your feet. Do not lift your head off the pillow. 3. Hold for __________ seconds. 4. Release the tension slowly. Relax your neck muscles completely before you repeat this exercise. Repeat __________ times. Complete this exercise __________ times a day. Exercise E: Cervical extension, isometric  1. Stand about 6 inches (15 cm) away from a wall, with your back facing the wall. 2. Place a soft object, about 6-8 inches (15-20 cm) in diameter, between the back of your head and the wall. A soft object could be a small pillow, a ball, or a folded towel. 3. Gently tilt your head back and press into the soft object. Keep your jaw and forehead relaxed. 4. Hold for __________ seconds. 5. Release the tension slowly. Relax your neck muscles completely before you repeat this exercise. Repeat __________ times. Complete this exercise __________ times a day. Posture and body mechanics  Body mechanics refers to the movements and positions of   your body while you do your daily activities. Posture is part of body mechanics. Good posture and healthy body mechanics can help to relieve stress in your body's tissues and joints. Good posture means that your spine is in its natural S-curve position (your spine is neutral), your shoulders are pulled back slightly, and your head is not tipped forward. The following are general guidelines for applying improved posture and body mechanics to  your everyday activities. Standing  When standing, keep your spine neutral and keep your feet about hip-width apart. Keep a slight bend in your knees. Your ears, shoulders, and hips should line up.  When you do a task in which you stand in one place for a long time, place one foot up on a stable object that is 2-4 inches (5-10 cm) high, such as a footstool. This helps keep your spine neutral. Sitting   When sitting, keep your spine neutral and your keep feet flat on the floor. Use a footrest, if necessary, and keep your thighs parallel to the floor. Avoid rounding your shoulders, and avoid tilting your head forward.  When working at a desk or a computer, keep your desk at a height where your hands are slightly lower than your elbows. Slide your chair under your desk so you are close enough to maintain good posture.  When working at a computer, place your monitor at a height where you are looking straight ahead and you do not have to tilt your head forward or downward to look at the screen. Resting When lying down and resting, avoid positions that are most painful for you. Try to support your neck in a neutral position. You can use a contour pillow or a small rolled-up towel. Your pillow should support your neck but not push on it. This information is not intended to replace advice given to you by your health care provider. Make sure you discuss any questions you have with your health care provider. Document Released: 10/20/2005 Document Revised: 06/26/2016 Document Reviewed: 09/26/2015 Elsevier Interactive Patient Education  2018 [employer]  Cervical Collar A cervical collar is a device that supports your chin and the back of your head. It is used after a severe neck injury to protect your head and neck. It does this by restricting the movement of the top part of your spine, which is located in your neck. A cervical collar may be used when you have:  A fractured neck.  Ligament  damage.  A spinal cord injury.  What instructions should I follow?  Wear the collar for as long as your health care provider instructs.  Follow your health care provider's instructions about how to put on and take off your collar.  Do not make your collar so tight that you feel pain or it is hard for you to breathe.  Do not remove the collar unless your health care provider says it is okay. Ask your health care provider if you can remove the collar for showering or eating or to apply ice.  Do not drive a car until your health care provider says it is okay.  Keep all follow-up visits as directed by your health care provider. This is important. Any delay in getting necessary care can keep your injury from healing properly.  Apply ice to the injured area: ? Put ice in a plastic bag. ? Place a towel between your skin and the bag. ? Leave the ice on for 20 minutes, 2-3 times per day for the  first 2 days. This information is not intended to replace advice given to you by your health care provider. Make sure you discuss any questions you have with your health care provider. Document Released: 07/12/2004 Document Revised: 02/28/2016 Document Reviewed: 05/29/2014 Elsevier Interactive Patient Education  [employer].

## 2018-06-29 NOTE — Progress Notes (Signed)
Subjective:    Patient ID: Kristin Leonard, female    DOB: Mar 16, 1951, 67 y.o.   MRN: [id number]  HPI  67 year old patient who presents with a chief complaint of right posterior lateral neck pain of 10 days duration. This began the following day after an overnight stay at a hotel she has been using heating pad and anti-inflammatory drugs without much benefit. No radicular symptoms pain is aggravated by head turning to the left  Past Medical History:  Diagnosis Date  . ASTHMA 04/26/2007  . CORONARY ARTERY SPASM 04/26/2007  . HYPOTHYROIDISM 02/07/2010  . NECK PAIN 02/01/2008  . SUPRAVENTRICULAR TACHYCARDIA, HX OF 04/26/2007     Social History   Socioeconomic History  . Marital status: Married    Spouse name: Not on file  . Number of children: Not on file  . Years of education: Not on file  . Highest education level: Not on file  Occupational History  . Not on file  Social Needs  . Financial resource strain: Not on file  . Food insecurity:    Worry: Not on file    Inability: Not on file  . Transportation needs:    Medical: Not on file    Non-medical: Not on file  Tobacco Use  . Smoking status: Never Smoker  . Smokeless tobacco: Never Used  Substance and Sexual Activity  . Alcohol use: Yes    Alcohol/week: 3.0 standard drinks    Types: 3 Glasses of wine per week  . Drug use: No  . Sexual activity: Not on file  Lifestyle  . Physical activity:    Days per week: Not on file    Minutes per session: Not on file  . Stress: Not on file  Relationships  . Social connections:    Talks on phone: Not on file    Gets together: Not on file    Attends religious service: Not on file    Active member of club or organization: Not on file    Attends meetings of clubs or organizations: Not on file    Relationship status: Not on file  . Intimate partner violence:    Fear of current or ex partner: Not on file    Emotionally abused: Not on file    Physically abused: Not on file   Forced sexual activity: Not on file  Other Topics Concern  . Not on file  Social History Narrative  . Not on file    Past Surgical History:  Procedure Laterality Date  . BREAST SURGERY     biopsy  . LASER ABLATION     x2  . TUBAL LIGATION      History reviewed. No pertinent family history.  No Known Allergies  Current Outpatient Medications on File Prior to Visit  Medication Sig Dispense Refill  . albuterol (VENTOLIN HFA) 108 (90 Base) MCG/ACT inhaler INHALE 2 PUFSS BY MOUTH EVERY 6 HOURS AS NEEDED FOR WHEEZING 18 g 4  . ALPRAZolam (XANAX) 0.5 MG tablet TAKE 1 TABLET BY MOUTH TWICE DAILY AS NEEDED 60 tablet 2  . budesonide-formoterol (SYMBICORT) 160-4.5 MCG/ACT inhaler Inhale 2 puffs into the lungs 2 (two) times daily. 1 Inhaler 3  . cyanocobalamin (,VITAMIN B-12,) 1000 MCG/ML injection Inject 1 mL (1,000 mcg total) into the muscle as directed. Inject 1,000 mcg (1 mL) weekly for two weeks, then inject 1,000 mcg (1 mL) once monthly. 3 mL 5  . diltiazem (CARTIA XT) 180 MG 24 hr capsule Take 1 capsule (180 mg  total) by mouth daily. 90 capsule 2  . levothyroxine (SYNTHROID) 112 MCG tablet Take 1 tablet (112 mcg total) by mouth daily. 90 tablet 3  . propranolol (INDERAL) 40 MG tablet Take 1 tablet (40 mg total) by mouth 3 (three) times daily as needed. 270 tablet 3   No current facility-administered medications on file prior to visit.     BP (!) 100/58 (BP Location: Right Arm, Patient Position: Sitting, Cuff Size: Large)   Pulse 69   Temp 98.1 F (36.7 C) (Oral)   Wt 189 lb (85.7 kg)   SpO2 98%   BMI 32.44 kg/m     Review of Systems  Constitutional: Negative.   Musculoskeletal: Positive for neck pain and neck stiffness.       Objective:   Physical Exam  Constitutional: She appears well-developed and well-nourished. No distress.  Blood pressure 122/80  Neck: Normal range of motion.  Intact range of motion of the neck Increased pain with head turning to the  left Tenderness of the right SCM muscle and trapezius           Assessment & Plan:   Neck pain.  Will treat with Depo-Medrol 80;  soft cervical collar encouraged Consider a chest dose of alprazolam for muscle relaxant properties   Marletta Lor

## 2018-07-12 ENCOUNTER — Other Ambulatory Visit: Payer: Self-pay | Admitting: Internal Medicine

## 2018-07-12 MED FILL — CARTIA XT 180 MG CAPSULE SA: 180 | 90 days supply | Qty: 90 | Fill #1

## 2018-07-15 MED FILL — ALPRAZolam 0.5 MG TABS: 0.5 | 30 days supply | Qty: 60 | Fill #0

## 2018-09-08 ENCOUNTER — Other Ambulatory Visit: Payer: Self-pay | Admitting: Internal Medicine

## 2018-09-09 MED FILL — SYNTHROID 112 MCG TABLET: 112 | 90 days supply | Qty: 90 | Fill #0

## 2018-09-09 MED FILL — VENTOLIN HFA 90 MCG INHALER: 108 (90 BAS | 25 days supply | Qty: 18 | Fill #0

## 2018-09-09 MED FILL — CYANOCOBALAMIN 1,000 MCG/ML: 1000 | 84 days supply | Qty: 3 | Fill #0

## 2018-09-14 ENCOUNTER — Encounter: Payer: Self-pay | Admitting: Family Medicine

## 2018-09-14 ENCOUNTER — Ambulatory Visit (INDEPENDENT_AMBULATORY_CARE_PROVIDER_SITE_OTHER): Payer: 59

## 2018-09-14 ENCOUNTER — Ambulatory Visit: Payer: 59 | Admitting: Family Medicine

## 2018-09-14 VITALS — BP 122/84 | HR 74 | Temp 98.1°F | Resp 12 | Ht 64.0 in | Wt 187.5 lb

## 2018-09-14 DIAGNOSIS — R0781 Pleurodynia: Secondary | ICD-10-CM

## 2018-09-14 DIAGNOSIS — E890 Postprocedural hypothyroidism: Secondary | ICD-10-CM | POA: Diagnosis not present

## 2018-09-14 DIAGNOSIS — Z23 Encounter for immunization: Secondary | ICD-10-CM | POA: Diagnosis not present

## 2018-09-14 DIAGNOSIS — I471 Supraventricular tachycardia: Secondary | ICD-10-CM

## 2018-09-14 DIAGNOSIS — F419 Anxiety disorder, unspecified: Secondary | ICD-10-CM | POA: Diagnosis not present

## 2018-09-14 DIAGNOSIS — E538 Deficiency of other specified B group vitamins: Secondary | ICD-10-CM | POA: Insufficient documentation

## 2018-09-14 LAB — T4, FREE: FREE T4: 0.84 ng/dL (ref 0.60–1.60)

## 2018-09-14 LAB — COMPREHENSIVE METABOLIC PANEL
ALBUMIN: 4.6 g/dL (ref 3.5–5.2)
ALT: 29 U/L (ref 0–35)
AST: 26 U/L (ref 0–37)
Alkaline Phosphatase: 132 U/L — ABNORMAL HIGH (ref 39–117)
BILIRUBIN TOTAL: 0.7 mg/dL (ref 0.2–1.2)
BUN: 24 mg/dL — ABNORMAL HIGH (ref 6–23)
CALCIUM: 9.7 mg/dL (ref 8.4–10.5)
CO2: 22 meq/L (ref 19–32)
Chloride: 106 mEq/L (ref 96–112)
Creatinine, Ser: 0.92 mg/dL (ref 0.40–1.20)
GFR: 64.89 mL/min (ref >60.00)
Glucose, Bld: 128 mg/dL — ABNORMAL HIGH (ref 70–99)
Potassium: 4 mEq/L (ref 3.5–5.1)
Sodium: 138 mEq/L (ref 135–145)
Total Protein: 7.5 g/dL (ref 6.0–8.3)

## 2018-09-14 LAB — CBC
HCT: 43 % (ref 36.0–46.0)
HEMOGLOBIN: 14.9 g/dL (ref 12.0–15.0)
MCHC: 34.6 g/dL (ref 30.0–36.0)
MCV: 92.1 fl (ref 78.0–100.0)
PLATELETS: 239 10*3/uL (ref 150.0–400.0)
RBC: 4.67 Mil/uL (ref 3.87–5.11)
RDW: 13.6 % (ref 11.5–15.5)
WBC: 7.8 10*3/uL (ref 4.0–10.5)

## 2018-09-14 LAB — VITAMIN B12: Vitamin B-12: >1525 pg/mL — ABNORMAL HIGH (ref 211–911)

## 2018-09-14 LAB — TSH: TSH: 0.47 u[IU]/mL (ref 0.35–4.50)

## 2018-09-14 MED ORDER — PROPRANOLOL HCL 40 MG PO TABS: 40.0000 mg | ORAL_TABLET | Freq: Three times a day (TID) | ORAL | 3 refills | Status: DC | PRN

## 2018-09-14 MED ORDER — SYNTHROID 112 MCG PO TABS: 112.0000 ug | ORAL_TABLET | Freq: Every day | ORAL | 3 refills | Status: DC

## 2018-09-14 MED ORDER — DILTIAZEM HCL ER COATED BEADS 180 MG PO CP24: 180.0000 mg | ORAL_CAPSULE | Freq: Every day | ORAL | 2 refills | Status: DC

## 2018-09-14 MED ORDER — ALPRAZOLAM 0.5 MG PO TABS: 0.5000 mg | ORAL_TABLET | Freq: Every day | ORAL | 3 refills | Status: DC | PRN

## 2018-09-14 MED FILL — PROPRANOLOL 40 MG TABLET: 40 | 30 days supply | Qty: 90 | Fill #0

## 2018-09-14 MED FILL — ALPRAZolam 0.5 MG TABS: 0.5 | 30 days supply | Qty: 30 | Fill #0

## 2018-09-14 NOTE — Patient Instructions (Addendum)
A few things to remember from today's visit:   Postablative hypothyroidism - Plan: T4, free, TSH  Paroxysmal supraventricular tachycardia (HCC)  Anxiety disorder, unspecified type  Costal margin pain - Plan: DG Ribs Bilateral W/Chest, Comprehensive metabolic panel, CBC  T62 deficiency - Plan: Vitamin B12   Please be sure medication list is accurate. If a new problem present, please set up appointment sooner than planned today.

## 2018-09-14 NOTE — Assessment & Plan Note (Signed)
No changes in B12 1000 mcg every 4 weeks. Further recommendation will be given according to B12 results. Follow-up in 6 months.

## 2018-09-14 NOTE — Assessment & Plan Note (Signed)
Problem seems to be well controlled. She will continue propranolol 40 mg 3 times daily as needed. Follow-up in 6 to 12 months.

## 2018-09-14 NOTE — Progress Notes (Signed)
HPI:   [name] is a 67 y.o. female, who is here today for establishing care. Former pt of Dr. Burnice Logan.  I saw her last on 12/30/2017 for acute visit.  History of B12 deficiency, currently she is on B12 1000 mcg every 4 weeks.  Lab Results  Component Value Date   VITAMINB12 87 (L) 07/14/2017    Anxiety on alprazolam 0.5 mg daily as needed, in average she takes medication about 2-3 times per week. She has been on alprazolam for 10+ years, tolerated well. She denies depressed mood or suicidal thoughts.   Hypothyroidism:  S/P radioablation. Currently on Synthroid 112 mcg daily. Tolerating medication well, no side effects reported. She has not noted dysphagia, palpitations, abdominal pain, changes in bowel habits, tremor, cold/heat intolerance, or abnormal weight loss.  Lab Results  Component Value Date   TSH 1.88 07/13/2017   Paroxysmal supraventricular tachycardia: Hx of Takotsubo cardiomyopathy, she has had 2 episodes one at 67 years old and a second 1 at 3. Denies dyspnea, orthopnea, or PND.  She is no longer following with cardiologist. Currently she is on propranolol 40 mg 3 times per day as needed, usually she has episodes of tachycardia every 1 to 2 weeks. Max HR 140/minute. She is also on diltiazem 180 mg daily.  Asthma: Currently she is on Symbicort 160-4.5 mcg twice daily as needed. Albuterol inhaler 2 puff every 4-6 hours as needed. Symptoms exacerbated around winter and with URI.   Today she [place] c/o 2 months of bilateral rib cage pain, R>L. Achy like,intermittent,2-3/10. She has not identified exacerbating or alleviating factors. No Hx of trauma. Problem has been stable.  She has tried Aleve.  Denies cough,wheezing,chest pain,diaphoresis,skin rash or edema.   Review of Systems  Constitutional: Negative for activity change, appetite change, fatigue and fever.  HENT: Negative for mouth sores, nosebleeds and trouble swallowing.    Eyes: Negative for redness and visual disturbance.  Respiratory: Negative for cough, shortness of breath and wheezing.   Cardiovascular: Positive for palpitations. Negative for chest pain and leg swelling.  Gastrointestinal: Negative for abdominal pain, nausea and vomiting.       Negative for changes in bowel habits.  Genitourinary: Negative for decreased urine volume, dysuria and hematuria.  Allergic/Immunologic: Positive for environmental allergies.  Neurological: Negative for syncope, weakness and headaches.  Psychiatric/Behavioral: Negative for confusion. The patient is nervous/anxious.     Current Outpatient Medications on File Prior to Visit  Medication Sig Dispense Refill  . albuterol (VENTOLIN HFA) 108 (90 Base) MCG/ACT inhaler INHALE 2 PUFFS BY MOUTH EVERY 6 HOURS AS NEEDED FOR WHEEZING 18 g 4  . budesonide-formoterol (SYMBICORT) 160-4.5 MCG/ACT inhaler Inhale 2 puffs into the lungs 2 (two) times daily. 1 Inhaler 3  . cyanocobalamin (,VITAMIN B-12,) 1000 MCG/ML injection INJECT 1 ML INTO THE MUSCLE AS DIRECTED ONCE WEEKLY FOR 2 WEEKS, THEN ONCE A MONTH 3 mL 5  . triamcinolone cream (KENALOG) 0.1 % APPLY TO THE AFFECTED AREA(S) ON SKIN TWICE A DAY AS DIRECTED FOR 1 TO 2 WEEKS AS NEEDED FOR FLARE  2   No current facility-administered medications on file prior to visit.      Past Medical History:  Diagnosis Date  . ASTHMA 04/26/2007  . CORONARY ARTERY SPASM 04/26/2007  . HYPOTHYROIDISM 02/07/2010  . NECK PAIN 02/01/2008  . SUPRAVENTRICULAR TACHYCARDIA, HX OF 04/26/2007   No Known Allergies  Social History   Socioeconomic History  . Marital status: Married  Spouse name: Not on file  . Number of children: Not on file  . Years of education: Not on file  . Highest education level: Not on file  Occupational History  . Not on file  Social Needs  . Financial resource strain: Not on file  . Food insecurity:    Worry: Not on file    Inability: Not on file  . Transportation  needs:    Medical: Not on file    Non-medical: Not on file  Tobacco Use  . Smoking status: Never Smoker  . Smokeless tobacco: Never Used  Substance and Sexual Activity  . Alcohol use: Yes    Alcohol/week: 3.0 standard drinks    Types: 3 Glasses of wine per week  . Drug use: No  . Sexual activity: Not on file  Lifestyle  . Physical activity:    Days per week: Not on file    Minutes per session: Not on file  . Stress: Not on file  Relationships  . Social connections:    Talks on phone: Not on file    Gets together: Not on file    Attends religious service: Not on file    Active member of club or organization: Not on file    Attends meetings of clubs or organizations: Not on file    Relationship status: Not on file  Other Topics Concern  . Not on file  Social History Narrative  . Not on file    Vitals:   09/14/18 1418  BP: 122/84  Pulse: 74  Resp: 12  Temp: 98.1 F (36.7 C)  SpO2: 97%   Body mass index is 32.18 kg/m.    Physical Exam  Nursing note and vitals reviewed. Constitutional: She is oriented to person, place, and time. She appears well-developed. No distress.  HENT:  Head: Normocephalic and atraumatic.  Mouth/Throat: Oropharynx is clear and moist and mucous membranes are normal.  Eyes: Pupils are equal, round, and reactive to light. Conjunctivae are normal.  Cardiovascular: Normal rate and regular rhythm.  No murmur heard. Pulses:      Dorsalis pedis pulses are 2+ on the right side, and 2+ on the left side.  Respiratory: Effort normal and breath sounds normal. No respiratory distress. She exhibits no tenderness.  GI: Soft. She exhibits no mass. There is no hepatomegaly. There is no tenderness.  Musculoskeletal: She exhibits no edema.  Lymphadenopathy:    She has no cervical adenopathy.  Neurological: She is alert and oriented to person, place, and time. She has normal strength. No cranial nerve deficit. Gait normal.  Skin: Skin is warm. No rash  noted. No erythema.  Psychiatric: She has a normal mood and affect.  Well groomed, good eye contact.     ASSESSMENT AND PLAN:   [name] was seen today for follow-up.  Orders Placed This Encounter  Procedures  . DG Ribs Bilateral W/Chest  . Flu vaccine HIGH DOSE PF  . Comprehensive metabolic panel  . T4, free  . TSH  . CBC  . Vitamin B12   Lab Results  Component Value Date   VITAMINB12 >1525 (H) 09/14/2018   Lab Results  Component Value Date   ALT 29 09/14/2018   AST 26 09/14/2018   ALKPHOS 132 (H) 09/14/2018   BILITOT 0.7 09/14/2018   Lab Results  Component Value Date   CREATININE 0.92 09/14/2018   BUN 24 (H) 09/14/2018   NA 138 09/14/2018   K 4.0 09/14/2018   CL  106 09/14/2018   CO2 22 09/14/2018   Lab Results  Component Value Date   WBC 7.8 09/14/2018   HGB 14.9 09/14/2018   HCT 43.0 09/14/2018   MCV 92.1 09/14/2018   PLT 239.0 09/14/2018   Lab Results  Component Value Date   TSH 0.47 09/14/2018      Hypothyroidism No changes in current management, will follow labs done today and will give further recommendations accordingly. Follow-up in 12 months, before if needed.  Paroxysmal supraventricular tachycardia Problem seems to be well controlled. She will continue propranolol 40 mg 3 times daily as needed. Follow-up in 6 to 12 months.  B12 deficiency No changes in B12 1000 mcg every 4 weeks. Further recommendation will be given according to B12 results. Follow-up in 6 months.  Costal margin pain Possible causes discussed. Examination negative,pain was not reproducible. She would like to have imaging done. We will continue monitoring. Instructed about warning signs.  - DG Ribs Bilateral W/Chest; Future - Comprehensive metabolic panel - CBC - DG Ribs Bilateral W/Chest  Anxiety disorder, unspecified type Stable overall. She understands side effects of benzodiazepines. No changes in current management. F/U in 5-6  months.  Encounter for immunization - Flu vaccine HIGH DOSE PF   Return in about 6 months (around 03/15/2019).    Betty G. Martinique, MD  [facility]. [place] office.

## 2018-09-14 NOTE — Assessment & Plan Note (Signed)
No changes in current management, will follow labs done today and will give further recommendations accordingly. Follow-up in 12 months, before if needed. 

## 2018-09-15 ENCOUNTER — Encounter: Payer: Self-pay | Admitting: Family Medicine

## 2018-10-22 DIAGNOSIS — H524 Presbyopia: Secondary | ICD-10-CM | POA: Diagnosis not present

## 2018-10-22 MED FILL — ALPRAZolam 0.5 MG TABS: 0.5 | 30 days supply | Qty: 60 | Fill #1

## 2018-10-22 MED FILL — CARTIA XT 180 MG CAPSULE SA: 180 | 90 days supply | Qty: 90 | Fill #0

## 2018-10-22 MED FILL — VENTOLIN HFA 90 MCG INHALER: 108 (90 BAS | 25 days supply | Qty: 18 | Fill #1

## 2018-11-05 MED FILL — LOTEPREDNOL ETABONATE 0.5 %: 0.5 | 17 days supply | Qty: 5 | Fill #0

## 2018-11-09 DIAGNOSIS — L723 Sebaceous cyst: Secondary | ICD-10-CM | POA: Diagnosis not present

## 2018-11-10 DIAGNOSIS — D2339 Other benign neoplasm of skin of other parts of face: Secondary | ICD-10-CM | POA: Diagnosis not present

## 2018-11-10 DIAGNOSIS — D485 Neoplasm of uncertain behavior of skin: Secondary | ICD-10-CM | POA: Diagnosis not present

## 2018-11-25 MED FILL — CYANOCOBALAMIN 1,000 MCG/ML: 1000 | 84 days supply | Qty: 3 | Fill #1

## 2018-12-15 MED FILL — XIIDRA 5% EYE DROPS: 5 | 30 days supply | Qty: 60 | Fill #0

## 2018-12-27 MED FILL — SYNTHROID 112 MCG TABLET: 112 | 90 days supply | Qty: 90 | Fill #1

## 2019-01-19 DIAGNOSIS — Z01419 Encounter for gynecological examination (general) (routine) without abnormal findings: Secondary | ICD-10-CM | POA: Diagnosis not present

## 2019-01-19 DIAGNOSIS — Z1231 Encounter for screening mammogram for malignant neoplasm of breast: Secondary | ICD-10-CM | POA: Diagnosis not present

## 2019-01-19 DIAGNOSIS — Z6831 Body mass index (BMI) 31.0-31.9, adult: Secondary | ICD-10-CM | POA: Diagnosis not present

## 2019-01-24 MED FILL — ALBUTEROL SULFATE HFA 108 (: 108 (90 BAS | 25 days supply | Qty: 18 | Fill #0

## 2019-01-24 MED FILL — CARTIA XT 180 MG CAPSULE SA: 180 | 90 days supply | Qty: 90 | Fill #0

## 2019-03-14 MED FILL — ALBUTEROL SULFATE HFA 108 (: 108 (90 BAS | 25 days supply | Qty: 18 | Fill #1

## 2019-03-15 ENCOUNTER — Other Ambulatory Visit: Payer: Self-pay

## 2019-03-15 ENCOUNTER — Ambulatory Visit (INDEPENDENT_AMBULATORY_CARE_PROVIDER_SITE_OTHER): Payer: 59 | Admitting: Family Medicine

## 2019-03-15 ENCOUNTER — Encounter: Payer: Self-pay | Admitting: Family Medicine

## 2019-03-15 VITALS — Resp 12

## 2019-03-15 DIAGNOSIS — I471 Supraventricular tachycardia: Secondary | ICD-10-CM | POA: Diagnosis not present

## 2019-03-15 DIAGNOSIS — F419 Anxiety disorder, unspecified: Secondary | ICD-10-CM

## 2019-03-15 DIAGNOSIS — E538 Deficiency of other specified B group vitamins: Secondary | ICD-10-CM | POA: Diagnosis not present

## 2019-03-15 DIAGNOSIS — J4520 Mild intermittent asthma, uncomplicated: Secondary | ICD-10-CM | POA: Diagnosis not present

## 2019-03-15 MED ORDER — PROPRANOLOL HCL 40 MG PO TABS: 40.0000 mg | ORAL_TABLET | Freq: Three times a day (TID) | ORAL | 1 refills | Status: DC | PRN

## 2019-03-15 MED ORDER — ALPRAZOLAM 0.5 MG PO TABS: 0.5000 mg | ORAL_TABLET | Freq: Every day | ORAL | 3 refills | Status: DC | PRN

## 2019-03-15 MED ORDER — SYNTHROID 112 MCG PO TABS: 112.0000 ug | ORAL_TABLET | Freq: Every day | ORAL | 1 refills | Status: DC

## 2019-03-15 MED ORDER — CYANOCOBALAMIN 1000 MCG/ML IJ SOLN: 1000.0000 ug | INTRAMUSCULAR | 2 refills | Status: DC

## 2019-03-15 MED FILL — ALPRAZolam 0.5 MG TABS: 0.5 | 30 days supply | Qty: 30 | Fill #0

## 2019-03-15 MED FILL — SYNTHROID 112 MCG TABLET: 112 | 90 days supply | Qty: 90 | Fill #0

## 2019-03-15 MED FILL — PROPRANOLOL 40 MG TABLET: 40 | 30 days supply | Qty: 90 | Fill #0

## 2019-03-15 MED FILL — CYANOCOBALAMIN 1,000 MCG/ML: 1000 | 84 days supply | Qty: 2 | Fill #0

## 2019-03-15 NOTE — Assessment & Plan Note (Signed)
Problem is stable. No changes in propranolol 40 mg 3 times daily as needed.

## 2019-03-15 NOTE — Assessment & Plan Note (Signed)
Problem is stable. Continue taking Xanax 0.5 mg daily as needed. [place] controlled substance website report was reviewed. She understands possible side effects of benzodiazepines. Follow-up in 6 months.

## 2019-03-15 NOTE — Assessment & Plan Note (Signed)
Problem is well controlled. No changes in current management. 

## 2019-03-15 NOTE — Progress Notes (Signed)
Virtual Visit via Video Note   I connected with Kristin Leonard on 03/15/19 at  2:00 PM EDT by a video enabled telemedicine application and verified that I am speaking with the correct person using two identifiers.  Location patient: home Location provider:home office Persons participating in the virtual visit: patient, provider  I discussed the limitations of evaluation and management by telemedicine and the availability of in person appointments.She expressed understanding and agreed to proceed.   HPI: She is a 68 yo female who is following on some chronic medical problems. Last f/u visit on 09/14/18.No new problems since then.  B12 deficiency: She is on B12 1000 mcg q 6 weeks. Dose was adjusted last OV,decreased frequency.  Lab Results  Component Value Date   VITAMINB12 >1525 (H) 09/14/2018   Anxiety: She is taking Alprazolam 0.5 mg daily as needed. Last refill was on 10/22/18,apperently a Rx she had from former PCP for 60 tabs. Negative for depressed mood or suicidal thoughts.  She needs some of her Rx's to be sent to [facility] because [facility] is closed to the public for now. She is on Propranolol 40 mg TID as needed for paroxysmal supraventricular tachycardia. History of Takotsubo cardiomyopathy. She has episodes of tachycardia about twice per week. She denies chest pain, exertional dyspnea, or diaphoresis.  Asthma: She uses Symbicort 160-4.5 mcg twice daily as needed. She also has albuterol inhaler. Currently she is not having any cough, wheezing. Mild nasal congestion and rhinorrhea, attributed to seasonal allergies. Negative for chills, fever, unusual fatigue, sore throat, or body aches.   ROS: See pertinent positives and negatives per HPI.  Past Medical History:  Diagnosis Date  . ASTHMA 04/26/2007  . CORONARY ARTERY SPASM 04/26/2007  . HYPOTHYROIDISM 02/07/2010  . NECK PAIN 02/01/2008  . SUPRAVENTRICULAR TACHYCARDIA, HX OF 04/26/2007    Past  Surgical History:  Procedure Laterality Date  . BREAST SURGERY     biopsy  . LASER ABLATION     x2  . TUBAL LIGATION      No family history on file.  Social History   Socioeconomic History  . Marital status: Married    Spouse name: Not on file  . Number of children: Not on file  . Years of education: Not on file  . Highest education level: Not on file  Occupational History  . Not on file  Social Needs  . Financial resource strain: Not on file  . Food insecurity:    Worry: Not on file    Inability: Not on file  . Transportation needs:    Medical: Not on file    Non-medical: Not on file  Tobacco Use  . Smoking status: Never Smoker  . Smokeless tobacco: Never Used  Substance and Sexual Activity  . Alcohol use: Yes    Alcohol/week: 3.0 standard drinks    Types: 3 Glasses of wine per week  . Drug use: No  . Sexual activity: Not on file  Lifestyle  . Physical activity:    Days per week: Not on file    Minutes per session: Not on file  . Stress: Not on file  Relationships  . Social connections:    Talks on phone: Not on file    Gets together: Not on file    Attends religious service: Not on file    Active member of club or organization: Not on file    Attends meetings of clubs or organizations: Not on file    Relationship  status: Not on file  . Intimate partner violence:    Fear of current or ex partner: Not on file    Emotionally abused: Not on file    Physically abused: Not on file    Forced sexual activity: Not on file  Other Topics Concern  . Not on file  Social History Narrative  . Not on file      Current Outpatient Medications:  .  albuterol (VENTOLIN HFA) 108 (90 Base) MCG/ACT inhaler, INHALE 2 PUFFS BY MOUTH EVERY 6 HOURS AS NEEDED FOR WHEEZING, Disp: 18 g, Rfl: 4 .  ALPRAZolam (XANAX) 0.5 MG tablet, Take 1 tablet (0.5 mg total) by mouth daily as needed., Disp: 30 tablet, Rfl: 3 .  budesonide-formoterol (SYMBICORT) 160-4.5 MCG/ACT inhaler, Inhale 2  puffs into the lungs 2 (two) times daily., Disp: 1 Inhaler, Rfl: 3 .  cyanocobalamin (,VITAMIN B-12,) 1000 MCG/ML injection, Inject 1 mL (1,000 mcg total) into the muscle every 6 (six) weeks., Disp: 3 mL, Rfl: 2 .  diltiazem (CARTIA XT) 180 MG 24 hr capsule, Take 1 capsule (180 mg total) by mouth daily., Disp: 90 capsule, Rfl: 2 .  propranolol (INDERAL) 40 MG tablet, Take 1 tablet (40 mg total) by mouth 3 (three) times daily as needed., Disp: 90 tablet, Rfl: 1 .  SYNTHROID 112 MCG tablet, Take 1 tablet (112 mcg total) by mouth daily., Disp: 90 tablet, Rfl: 1 .  triamcinolone cream (KENALOG) 0.1 %, APPLY TO THE AFFECTED AREA(S) ON SKIN TWICE A DAY AS DIRECTED FOR 1 TO 2 WEEKS AS NEEDED FOR FLARE, Disp: , Rfl: 2  EXAM:  VITALS per patient if applicable:Resp 12   GENERAL: alert, oriented, appears well and in no acute distress  HEENT: atraumatic, conjunctiva clear, no obvious facial abnormalities on inspection.  NECK: normal movements of the head and neck  LUNGS: on inspection no signs of respiratory distress, breathing rate appears normal, no obvious gross SOB, gasping or wheezing  CV: no obvious cyanosis  Kristin: moves all visible extremities without noticeable abnormality  PSYCH/NEURO: pleasant and cooperative, no obvious depression or anxiety, speech and thought processing grossly intact  ASSESSMENT AND PLAN:  Discussed the following assessment and plan:  B12 deficiency No changes in current management. We will plan on checking B12 next visit, 09/2019.  Anxiety disorder, unspecified Problem is stable. Continue taking Xanax 0.5 mg daily as needed. [place] controlled substance website report was reviewed. She understands possible side effects of benzodiazepines. Follow-up in 6 months.  Paroxysmal supraventricular tachycardia Problem is stable. No changes in propranolol 40 mg 3 times daily as needed.   Asthma Problem is well controlled. No changes in current management.    I  discussed the assessment and treatment plan with the patient. Shewas provided an opportunity to ask questions and all were answered. The patient agreed with the plan and demonstrated an understanding of the instructions.   Return in about 6 months (around 09/15/2019) for cpe.    Andreas Sobolewski Martinique, MD

## 2019-03-15 NOTE — Assessment & Plan Note (Signed)
No changes in current management. We will plan on checking B12 next visit, 09/2019.

## 2019-05-10 MED FILL — CARTIA XT 180 MG CAPSULE SA: 180 | 90 days supply | Qty: 90 | Fill #1

## 2019-05-11 MED FILL — LOTEPREDNOL ETABONATE 0.5 %: 0.5 | 17 days supply | Qty: 5 | Fill #1

## 2019-05-25 MED FILL — ALPRAZolam 0.5 MG TABS: 0.5 | 30 days supply | Qty: 30 | Fill #1

## 2019-06-28 MED FILL — ALBUTEROL SULFATE HFA 108 (: 108 (90 BAS | 25 days supply | Qty: 18 | Fill #2

## 2019-06-28 MED FILL — CYANOCOBALAMIN 1,000 MCG/ML: 1000 | 84 days supply | Qty: 2 | Fill #1

## 2019-06-28 MED FILL — ALPRAZolam 0.5 MG TABS: 0.5 | 30 days supply | Qty: 30 | Fill #2

## 2019-06-28 MED FILL — SYNTHROID 112 MCG TABLET: 112 | 90 days supply | Qty: 90 | Fill #1

## 2019-06-29 DIAGNOSIS — L918 Other hypertrophic disorders of the skin: Secondary | ICD-10-CM | POA: Diagnosis not present

## 2019-06-29 DIAGNOSIS — Z419 Encounter for procedure for purposes other than remedying health state, unspecified: Secondary | ICD-10-CM | POA: Diagnosis not present

## 2019-06-29 DIAGNOSIS — L820 Inflamed seborrheic keratosis: Secondary | ICD-10-CM | POA: Diagnosis not present

## 2019-06-29 DIAGNOSIS — L2089 Other atopic dermatitis: Secondary | ICD-10-CM | POA: Diagnosis not present

## 2019-06-29 MED FILL — TRIAMCINOLONE 0.1% CREAM: 0.1 | 14 days supply | Qty: 80 | Fill #0

## 2019-08-03 ENCOUNTER — Other Ambulatory Visit: Payer: Self-pay

## 2019-08-03 ENCOUNTER — Ambulatory Visit (INDEPENDENT_AMBULATORY_CARE_PROVIDER_SITE_OTHER): Payer: 59

## 2019-08-03 DIAGNOSIS — Z23 Encounter for immunization: Secondary | ICD-10-CM

## 2019-08-11 ENCOUNTER — Other Ambulatory Visit: Payer: Self-pay | Admitting: Family Medicine

## 2019-08-11 MED FILL — ALPRAZolam 0.5 MG TABS: 0.5 | 30 days supply | Qty: 30 | Fill #3

## 2019-08-12 MED FILL — CARTIA XT 180 MG CAPSULE SA: 180 | 90 days supply | Qty: 90 | Fill #0

## 2019-09-04 MED FILL — XIIDRA 5% EYE DROPS: 5 | 30 days supply | Qty: 60 | Fill #1

## 2019-09-16 ENCOUNTER — Other Ambulatory Visit: Payer: Self-pay

## 2019-09-16 ENCOUNTER — Encounter: Payer: Self-pay | Admitting: Family Medicine

## 2019-09-16 ENCOUNTER — Ambulatory Visit (INDEPENDENT_AMBULATORY_CARE_PROVIDER_SITE_OTHER): Payer: 59 | Admitting: Family Medicine

## 2019-09-16 ENCOUNTER — Telehealth: Payer: Self-pay | Admitting: *Deleted

## 2019-09-16 VITALS — BP 120/64 | HR 78 | Temp 97.8°F | Resp 16 | Ht 64.0 in | Wt 195.2 lb

## 2019-09-16 DIAGNOSIS — Z0000 Encounter for general adult medical examination without abnormal findings: Secondary | ICD-10-CM

## 2019-09-16 DIAGNOSIS — Z13228 Encounter for screening for other metabolic disorders: Secondary | ICD-10-CM

## 2019-09-16 DIAGNOSIS — E890 Postprocedural hypothyroidism: Secondary | ICD-10-CM

## 2019-09-16 DIAGNOSIS — H8110 Benign paroxysmal vertigo, unspecified ear: Secondary | ICD-10-CM

## 2019-09-16 DIAGNOSIS — Z1211 Encounter for screening for malignant neoplasm of colon: Secondary | ICD-10-CM | POA: Diagnosis not present

## 2019-09-16 DIAGNOSIS — E538 Deficiency of other specified B group vitamins: Secondary | ICD-10-CM

## 2019-09-16 DIAGNOSIS — M25511 Pain in right shoulder: Secondary | ICD-10-CM

## 2019-09-16 DIAGNOSIS — I471 Supraventricular tachycardia: Secondary | ICD-10-CM | POA: Diagnosis not present

## 2019-09-16 DIAGNOSIS — Z1329 Encounter for screening for other suspected endocrine disorder: Secondary | ICD-10-CM

## 2019-09-16 DIAGNOSIS — Z130 Encounter for screening for diseases of the blood and blood-forming organs and certain disorders involving the immune mechanism: Secondary | ICD-10-CM

## 2019-09-16 DIAGNOSIS — E785 Hyperlipidemia, unspecified: Secondary | ICD-10-CM

## 2019-09-16 DIAGNOSIS — Z23 Encounter for immunization: Secondary | ICD-10-CM | POA: Diagnosis not present

## 2019-09-16 LAB — COMPREHENSIVE METABOLIC PANEL
ALT: 34 U/L (ref 0–35)
AST: 32 U/L (ref 0–37)
Albumin: 4.4 g/dL (ref 3.5–5.2)
Alkaline Phosphatase: 118 U/L — ABNORMAL HIGH (ref 39–117)
BUN: 22 mg/dL (ref 6–23)
CO2: 25 mEq/L (ref 19–32)
Calcium: 9.3 mg/dL (ref 8.4–10.5)
Chloride: 107 mEq/L (ref 96–112)
Creatinine, Ser: 0.91 mg/dL (ref 0.40–1.20)
GFR: 61.64 mL/min (ref >60.00)
Glucose, Bld: 106 mg/dL — ABNORMAL HIGH (ref 70–99)
Potassium: 4.4 mEq/L (ref 3.5–5.1)
Sodium: 140 mEq/L (ref 135–145)
Total Bilirubin: 0.8 mg/dL (ref 0.2–1.2)
Total Protein: 7.3 g/dL (ref 6.0–8.3)

## 2019-09-16 LAB — CBC
HCT: 41.3 % (ref 36.0–46.0)
Hemoglobin: 14.2 g/dL (ref 12.0–15.0)
MCHC: 34.3 g/dL (ref 30.0–36.0)
MCV: 92.5 fl (ref 78.0–100.0)
Platelets: 235 10*3/uL (ref 150.0–400.0)
RBC: 4.46 Mil/uL (ref 3.87–5.11)
RDW: 12.9 % (ref 11.5–15.5)
WBC: 6.9 10*3/uL (ref 4.0–10.5)

## 2019-09-16 LAB — LIPID PANEL
Cholesterol: 221 mg/dL — ABNORMAL HIGH (ref 0–200)
HDL: 42.5 mg/dL (ref >39.00)
Total CHOL/HDL Ratio: 5
Triglycerides: 420 mg/dL — ABNORMAL HIGH (ref 0.0–149.0)

## 2019-09-16 LAB — VITAMIN B12: Vitamin B-12: 262 pg/mL (ref 211–911)

## 2019-09-16 LAB — HEMOGLOBIN A1C: Hgb A1c MFr Bld: 5.5 % (ref 4.6–6.5)

## 2019-09-16 LAB — TSH: TSH: 1.32 u[IU]/mL (ref 0.35–4.50)

## 2019-09-16 LAB — LDL CHOLESTEROL, DIRECT: Direct LDL: 114 mg/dL

## 2019-09-16 MED ORDER — MECLIZINE HCL 25 MG PO TABS: 25.0000 mg | ORAL_TABLET | Freq: Three times a day (TID) | ORAL | 0 refills | Status: DC | PRN

## 2019-09-16 MED FILL — MECLIZINE 25 MG TABLET: 25 | 15 days supply | Qty: 45 | Fill #0

## 2019-09-16 NOTE — Patient Instructions (Addendum)
Today you have you routine preventive visit.  At least 150 minutes of moderate exercise per week, daily brisk walking for 15-30 min is a good exercise option. Healthy diet low in saturated (animal) fats and sweets and consisting of fresh fruits and vegetables, lean meats such as fish and white chicken and whole grains.  These are some of recommendations for screening depending of age and risk factors: A few things to remember from today's visit:   Routine general medical examination at a health care facility  Postablative hypothyroidism - Plan: TSH  Hyperlipidemia, unspecified hyperlipidemia type - Plan: Lipid panel  Paroxysmal supraventricular tachycardia ([place]) - Plan: EKG 12-Lead  Benign paroxysmal positional vertigo, unspecified laterality - Plan: CBC  Dizziness is a perception of movement, it is sometimes difficult to describe and can be  caused by different problems, most benign but others can be life threaten.  Vertigo is the most common cause of dizziness, usually related with inner ear and can be associated with nausea, vomiting, and unbalance sensation. It can be complicated by falls due to lose of balance; so fall precautions are very important.  Most of the time dizziness is benign, usually intermittent, last a few seconds at the time and aggravated by certain positions. It usually resolves in a few weeks without residual effect but it could be recurrent.  Sometimes blood work is ordered to evaluate for other possible causes.  Dizziness can also be caused by certain medications, dehydration, migraines, and strokes.  Medication prescribed for vertigo, Meclizine, causes drowsiness/sleepiness, so frequently I recommended taking it at bedtime. I also recommend what we called vestibular exercise, sometimes can be done at home (Modified Semont maneuvers) other times I refer patients to vestibular rehabilitation.   Seek immediate medical attention if: New severe headache, dobble  vision, fever (100 F or more), associated numbness/tingling, focal weakness, persistent vomiting, not able to walk, or sudden worsening symptoms.  Colon cancer screening - Plan: Cologuard  Screening for endocrine, metabolic and immunity disorder - Plan: Comprehensive metabolic panel, Hemoglobin A1c  Right shoulder pain, unspecified chronicity   - Vaccines:  Tdap vaccine every 10 years.  Shingles vaccine recommended at age 67, could be given after 67 years of age but not sure about insurance coverage.   Pneumonia vaccines:Pneumovax at 67. Given today.  Screening for diabetes at age 67 and every 3 years.  -Breast cancer: Mammogram: There is disagreement between experts about when to start screening in low risk asymptomatic female but recent recommendations are to start screening at 67 and not later than 67 years old , every 1-2 years and after 67 yo q 2 years. Screening is recommended until 67 years old but some women can continue screening depending of healthy issues.   Colon cancer screening: starts at 67 years old until 67 years old.Coluguanrd ordered. Also recommended:  1. Dental visit- Brush and floss your teeth twice daily; visit your dentist twice a year. 2. Eye doctor- Get an eye exam at least every 2 years. 3. Helmet use- Always wear a helmet when riding a bicycle, motorcycle, rollerblading or skateboarding. 4. Safe sex- If you may be exposed to sexually transmitted infections, use a condom. 5. Seat belts- Seat belts can save your live; always wear one. 6. Smoke/Carbon Monoxide detectors- These detectors need to be installed on the appropriate level of your home. Replace batteries at least once a year. 7. Skin cancer- When out in the sun please cover up and use sunscreen 15 SPF or higher. 8. Violence-  If anyone is threatening or hurting you, please tell your healthcare provider.  9. Drink alcohol in moderation- Limit alcohol intake to one drink or less per day. Never drink and  drive.

## 2019-09-16 NOTE — Progress Notes (Signed)
HPI:   Kristin Leonard is a 68 y.o. female, who is here today for her routine physical.  Last CPE: 3+ years ago. She follows with gyn and had her gyn preventive visit in 01/2019.  Regular exercise 3 or more time per week: Started doing yoga once per week for an hour and planning on increasing it. Following a healthy diet: She thinks so. She lives with her husband.  Chronic medical problems: Mitral valve prolapse,SVT, asthma,hypothyroidism,obesity,and HLD among some.  Immunization History  Administered Date(s) Administered  . Fluad Quad(high Dose 65+) 08/03/2019  . Influenza Split 09/01/2012  . Influenza Whole 09/10/2010  . Influenza, High Dose Seasonal PF 09/14/2018  . Influenza,inj,Quad PF,6+ Mos 10/30/2014, 12/03/2015, 07/13/2017  . Tdap 12/03/2015    Mammogram: 01/2019. Colonoscopy: Overdue. DEXA: 12/2017 at [place] gyn's office.  Hep C screening: 07/2017 NR.  She has some concerns today. Mild right shoulder pain intermittently for 6 weeks. Exacerbated by certain movements. Also mild achy trapezium. Denies neck pain,UE numbness,tingling,or limitation of ROM.  No Hx of trauma. She wants ot be sure she does not have axillary abnormalities causing pain. She has had similar shoulder pain before.  She would like to follow on B12.  She is having B12 1000 mcg q 6 weeks, frequency decreased in 09/2018. B12 on 09/14/2018 >1525.  SVT, occasionally she has palpitations but seldom. She is on Propranolol 40 mg tid prn. She has not followed with cardiologist in a while. She has not noted CP,dyspena,or diaphoresis.   Dizziness: 2-3 weeks of intermittent dizziness, "spinning head." Usually happens while she is in bed,turning. Also has noted when doing yoga mat exercises. It last a few seconds. No associated hearing loss,tinnitus,nausea, or focal deficit.  She has not taken OTC medication. Stable. No Hx of dizziness.  HLD: She is on non pharmacologic treatment.  Component     Latest Ref Rng & Units 07/13/2017  Cholesterol     0 - 200 mg/dL 183  Triglycerides     0.0 - 149.0 mg/dL 246.0 (H)  HDL Cholesterol     >39.00 mg/dL 42.70  VLDL     0.0 - 40.0 mg/dL 49.2 (H)  Total CHOL/HDL Ratio      4  NonHDL      139.85  LDL 100.  Hypothyroidism: S/P radioablation.  Currently she is on Synthroid 112 mcg daily. 09/2018 TSH 0.47.  Anxiety: He gyn prescribes Alprazolam 0.5 mg ,which she takes daily prn.   Review of Systems  Constitutional: Negative for appetite change, fatigue and fever.  HENT: Negative for hearing loss, mouth sores, sore throat and trouble swallowing.   Eyes: Negative for redness and visual disturbance.  Respiratory: Negative for cough and wheezing.   Cardiovascular: Negative for leg swelling.  Gastrointestinal: Negative for abdominal pain and vomiting.       No changes in bowel habits.  Endocrine: Negative for cold intolerance, heat intolerance, polydipsia, polyphagia and polyuria.  Genitourinary: Negative for decreased urine volume, dysuria, hematuria, vaginal bleeding and vaginal discharge.  Musculoskeletal: Negative for gait problem and joint swelling.  Skin: Negative for color change and rash.  Allergic/Immunologic: Positive for environmental allergies.  Neurological: Negative for syncope, facial asymmetry and headaches.  Hematological: Negative for adenopathy. Does not bruise/bleed easily.  Psychiatric/Behavioral: Negative for confusion and sleep disturbance. The patient is nervous/anxious.   All other systems reviewed and are negative.   Current Outpatient Medications on File Prior to Visit  Medication Sig Dispense Refill  . albuterol (  VENTOLIN HFA) 108 (90 Base) MCG/ACT inhaler INHALE 2 PUFFS BY MOUTH EVERY 6 HOURS AS NEEDED FOR WHEEZING 18 g 4  . ALPRAZolam (XANAX) 0.5 MG tablet Take 1 tablet (0.5 mg total) by mouth daily as needed. 30 tablet 3  . budesonide-formoterol (SYMBICORT) 160-4.5 MCG/ACT inhaler Inhale  2 puffs into the lungs 2 (two) times daily. 1 Inhaler 3  . CARTIA XT 180 MG 24 hr capsule TAKE 1 CAPSULE BY MOUTH DAILY. 90 capsule 1  . cyanocobalamin (,VITAMIN B-12,) 1000 MCG/ML injection Inject 1 mL (1,000 mcg total) into the muscle every 6 (six) weeks. 3 mL 2  . propranolol (INDERAL) 40 MG tablet Take 1 tablet (40 mg total) by mouth 3 (three) times daily as needed. 90 tablet 1  . SYNTHROID 112 MCG tablet Take 1 tablet (112 mcg total) by mouth daily. 90 tablet 1  . triamcinolone cream (KENALOG) 0.1 % APPLY TO THE AFFECTED AREA(S) ON SKIN TWICE A DAY AS DIRECTED FOR 1 TO 2 WEEKS AS NEEDED FOR FLARE  2   No current facility-administered medications on file prior to visit.      Past Medical History:  Diagnosis Date  . ASTHMA 04/26/2007  . CORONARY ARTERY SPASM 04/26/2007  . HYPOTHYROIDISM 02/07/2010  . NECK PAIN 02/01/2008  . SUPRAVENTRICULAR TACHYCARDIA, HX OF 04/26/2007    Past Surgical History:  Procedure Laterality Date  . BREAST SURGERY     biopsy  . LASER ABLATION     x2  . TUBAL LIGATION      No Known Allergies  History reviewed. No pertinent family history.  Social History   Socioeconomic History  . Marital status: Married    Spouse name: Not on file  . Number of children: Not on file  . Years of education: Not on file  . Highest education level: Not on file  Occupational History  . Not on file  Social Needs  . Financial resource strain: Not on file  . Food insecurity    Worry: Not on file    Inability: Not on file  . Transportation needs    Medical: Not on file    Non-medical: Not on file  Tobacco Use  . Smoking status: Never Smoker  . Smokeless tobacco: Never Used  Substance and Sexual Activity  . Alcohol use: Yes    Alcohol/week: 3.0 standard drinks    Types: 3 Glasses of wine per week  . Drug use: No  . Sexual activity: Not on file  Lifestyle  . Physical activity    Days per week: Not on file    Minutes per session: Not on file  . Stress: Not on  file  Relationships  . Social Herbalist on phone: Not on file    Gets together: Not on file    Attends religious service: Not on file    Active member of club or organization: Not on file    Attends meetings of clubs or organizations: Not on file    Relationship status: Not on file  Other Topics Concern  . Not on file  Social History Narrative  . Not on file     Vitals:   09/16/19 0726  BP: 120/64  Pulse: 78  Resp: 16  Temp: 97.8 F (36.6 C)  SpO2: 93%   Body mass index is 33.51 kg/m.   Wt Readings from Last 3 Encounters:  09/16/19 195 lb 3.2 oz (88.5 kg)  09/14/18 187 lb 8 oz (85 kg)  06/29/18  189 lb (85.7 kg)    Physical Exam  Nursing note and vitals reviewed. Constitutional: She is oriented to person, place, and time. She appears well-developed. No distress.  HENT:  Head: Normocephalic and atraumatic.  Right Ear: Hearing, tympanic membrane, external ear and ear canal normal.  Left Ear: Hearing, tympanic membrane, external ear and ear canal normal.  Mouth/Throat: Uvula is midline, oropharynx is clear and moist and mucous membranes are normal.  Apley maneuver positive, right. Did not complete evaluation due to spinning sensation, lasted a couple seconds. + Nystagmus.  Eyes: Pupils are equal, round, and reactive to light. Conjunctivae and EOM are normal.  Neck: Carotid bruit is not present. No tracheal deviation present. No thyroid mass and no thyromegaly present.  Cardiovascular: Normal rate and regular rhythm.  No murmur heard. Pulses:      Dorsalis pedis pulses are 2+ on the right side and 2+ on the left side.  Respiratory: Effort normal and breath sounds normal. No respiratory distress.  GI: Soft. She exhibits no mass. There is no hepatomegaly. There is no abdominal tenderness.  Genitourinary:    Genitourinary Comments: Deferred to gyn.   Musculoskeletal:        General: No edema.     Right shoulder: She exhibits normal range of motion, no  tenderness and no bony tenderness.     Comments: No major deformity or signs of synovitis appreciated.  Lymphadenopathy:    She has no cervical adenopathy.       Right axillary: No pectoral and no lateral adenopathy present.       Right: No supraclavicular adenopathy present.       Left: No supraclavicular adenopathy present.  Neurological: She is alert and oriented to person, place, and time. She has normal strength. No cranial nerve deficit. Coordination and gait normal.  Reflex Scores:      Bicep reflexes are 2+ on the right side and 2+ on the left side.      Patellar reflexes are 2+ on the right side and 2+ on the left side. Skin: Skin is warm. No rash noted. No erythema.  Psychiatric: She has a normal mood and affect. Cognition and memory are normal.  Well groomed, good eye contact.    ASSESSMENT AND PLAN:  Kristin Leonard was here today annual physical examination.  Orders Placed This Encounter  Procedures  . Pneumococcal polysaccharide vaccine 23-valent greater than or equal to 2yo subcutaneous/IM  . CBC  . Comprehensive metabolic panel  . Hemoglobin A1c  . TSH  . Lipid panel  . Cologuard  . Vitamin B12  . LDL cholesterol, direct  . EKG 12-Lead   Lab Results  Component Value Date   ALT 34 09/16/2019   AST 32 09/16/2019   ALKPHOS 118 (H) 09/16/2019   BILITOT 0.8 09/16/2019   Lab Results  Component Value Date   HGBA1C 5.5 09/16/2019    Lab Results  Component Value Date   CHOL 221 (H) 09/16/2019   HDL 42.50 09/16/2019   LDLCALC 104 11/15/2015   LDLDIRECT 114.0 09/16/2019   TRIG (H) 09/16/2019    420.0 Triglyceride is over 400; calculations on Lipids are invalid.   CHOLHDL 5 09/16/2019    Lab Results  Component Value Date   TSH 1.32 09/16/2019   Lab Results  Component Value Date   CREATININE 0.91 09/16/2019   BUN 22 09/16/2019   NA 140 09/16/2019   K 4.4 09/16/2019   CL 107 09/16/2019   CO2 25  09/16/2019   Lab Results  Component Value  Date   VITAMINB12 262 09/16/2019     Routine general medical examination at a health care facility We discussed the importance of regular physical activity and healthy diet for prevention of chronic illness and/or complications. Preventive guidelines reviewed. Vaccination updated, Pneumovax given today. Continue following with gyn for her routine female Ca++ and vit D supplementation to continue. Next CPE in a year.  The 10-year ASCVD risk score Mikey Bussing DC Brooke Bonito., et al., 2013) is: 6.9%   Values used to calculate the score:     Age: 23 years     Sex: Female     Is Non-Hispanic African American: No     Diabetic: No     Tobacco smoker: No     Systolic Blood Pressure: 123456 mmHg     Is BP treated: No     HDL Cholesterol: 42.5 mg/dL     Total Cholesterol: 221 mg/dL  Postablative hypothyroidism No changes in current management, will follow labs done today and will give further recommendations accordingly.  Hyperlipidemia, unspecified hyperlipidemia type Continue non pharmacologic treatment. Further recommendations according to lipid panel results and 10 years CVD risk.  Paroxysmal supraventricular tachycardia (HCC) Stable. Continue Propranolol 40 mg tid as needed.  Due to technical problems EKG was not done today. Will arrange nurse visit for future EKG.  -     EKG 12-Lead  Benign paroxysmal positional vertigo, unspecified laterality We discussed other possible etiologies of dizziness, Hx and examination today suggest benign vertigo. I do not think further work-up is necessary at this time but needs to be consider if worsening or persient symptoms for more that 2 weeks; in this case ENT will be considered. Explained that problem can be recurrent. Fall prevention. Vestibular exercises recommended, handout with [place] maneuvers given. Meclizine 25 mg tid prn, some side effects discussed. Instructed about warning signs. F/U as needed.  -     CBC -     meclizine (ANTIVERT) 25 MG  tablet; Take 1 tablet (25 mg total) by mouth 3 (three) times daily as needed for dizziness.  Colon cancer screening -     Cologuard  Screening for endocrine, metabolic and immunity disorder -     Comprehensive metabolic panel -     Hemoglobin A1c  Right shoulder pain, unspecified chronicity No Hx of trauma,so I do not think imaging is needed today. ROM exercises. No axillary masses. F/U as needed.  Need for pneumococcal vaccination -     Pneumococcal polysaccharide vaccine 23-valent greater than or equal to 2yo subcutaneous/IM  B12 deficiency No changes in current management, will follow labs done today and will give further recommendations accordingly.  Other orders -     LDL cholesterol, direct    Return in 1 year (on 09/15/2020), or if symptoms worsen or fail to improve, for cpe.   Betty G. Martinique, MD  [facility]. [place] office.

## 2019-09-16 NOTE — Telephone Encounter (Signed)
Copied from [place] [phone]. Topic: General - Inquiry >> Sep 16, 2019 12:30 PM Richardo Priest, [place] wrote: Reason for CRM: pt called in to check on status of med refill and new script for vertigo. Please advise.

## 2019-09-19 ENCOUNTER — Other Ambulatory Visit: Payer: Self-pay

## 2019-09-19 ENCOUNTER — Telehealth: Payer: Self-pay | Admitting: Family Medicine

## 2019-09-19 DIAGNOSIS — E538 Deficiency of other specified B group vitamins: Secondary | ICD-10-CM

## 2019-09-19 MED ORDER — SIMVASTATIN 20 MG PO TABS: 20.0000 mg | ORAL_TABLET | Freq: Every day | ORAL | 3 refills | Status: DC

## 2019-09-19 MED ORDER — CYANOCOBALAMIN 1000 MCG/ML IJ SOLN: 1000.0000 ug | INTRAMUSCULAR | 2 refills | Status: DC

## 2019-09-19 MED ORDER — SYNTHROID 112 MCG PO TABS: 112.0000 ug | ORAL_TABLET | Freq: Every day | ORAL | 1 refills | Status: DC

## 2019-09-19 MED FILL — SIMVASTATIN 20 MG TABLET: 20 | 90 days supply | Qty: 90 | Fill #0

## 2019-09-19 MED FILL — CYANOCOBALAMIN 1,000 MCG/ML: 1000 | 70 days supply | Qty: 2 | Fill #0

## 2019-09-19 MED FILL — SYNTHROID 112 MCG TABLET: 112 | 90 days supply | Qty: 90 | Fill #0

## 2019-09-19 NOTE — Telephone Encounter (Signed)
Please advise 

## 2019-09-19 NOTE — Telephone Encounter (Signed)
[facility] calling [facility] outpatient pharmacy is calling regarding simvastatin (ZOCOR) 20 MG tablet [id number]  - packet states to start with 10mg  if on CARTIA XT 180 MG 24 hr capsule [id number]  Please advise Cb- [phone]

## 2019-09-20 NOTE — Telephone Encounter (Signed)
Rx was sent on 09/16/19. Betty Martinique, MD

## 2019-09-20 NOTE — Telephone Encounter (Signed)
Simvastatin 10 mg is a very low dose,I do not think it will bring numbers to goal.  The risk of interaction if greater with Amlodipine but if there is a concern, Crestor 10 mg can be sent to pharmacy to replace Simvastatin. Crestor 10 mg to take 1 tablet daily.  Thanks, BJ

## 2019-09-21 MED ORDER — ROSUVASTATIN CALCIUM 10 MG PO TABS: 10.0000 mg | ORAL_TABLET | Freq: Every day | ORAL | 3 refills | Status: DC

## 2019-09-21 MED FILL — ROSUVASTATIN CALCIUM 10 MG: 10 | 90 days supply | Qty: 90 | Fill #0

## 2019-10-13 ENCOUNTER — Telehealth: Payer: Self-pay | Admitting: *Deleted

## 2019-10-13 NOTE — Telephone Encounter (Signed)
Copied from [place] [phone]. Topic: General - Inquiry >> Oct 13, 2019  9:40 AM Mathis Bud wrote: Reason for CRM: Patient is requesting a call back from PCP nurse regarding medication and mychart message   Call back [phone]

## 2019-10-14 NOTE — Telephone Encounter (Signed)
I called and spoke with pt. She is currently taking the Simvastatin 20 mg once daily. She is doing fine, no side effects. Pt will continue medication and follow up in 3-4 months for re-check.   I called pharmacy and canceled the Rx for the Crestor.  Pt is requesting Cologuard instead of the colonoscopy. Form filled out & faxed to [employer] with pt demographics and insurance.   Dr. Martinique - FYI.

## 2019-10-24 ENCOUNTER — Other Ambulatory Visit: Payer: Self-pay | Admitting: Family Medicine

## 2019-10-24 DIAGNOSIS — F419 Anxiety disorder, unspecified: Secondary | ICD-10-CM

## 2019-10-24 MED ORDER — ALBUTEROL SULFATE HFA 108 (90 BASE) MCG/ACT IN AERS: INHALATION_SPRAY | RESPIRATORY_TRACT | 4 refills | Status: DC

## 2019-10-24 MED FILL — ALPRAZolam 0.5 MG TABS: 0.5 | 30 days supply | Qty: 30 | Fill #0

## 2019-10-24 MED FILL — ALBUTEROL SULFATE HFA 108 (: 108 (90 BAS | 25 days supply | Qty: 18 | Fill #0

## 2019-10-24 MED FILL — PROPRANOLOL 40 MG TABLET: 40 | 30 days supply | Qty: 90 | Fill #1

## 2019-10-24 NOTE — Telephone Encounter (Signed)
Last Ov:09/2019

## 2019-11-14 DIAGNOSIS — Z1212 Encounter for screening for malignant neoplasm of rectum: Secondary | ICD-10-CM | POA: Diagnosis not present

## 2019-11-14 DIAGNOSIS — Z1211 Encounter for screening for malignant neoplasm of colon: Secondary | ICD-10-CM | POA: Diagnosis not present

## 2019-11-14 LAB — COLOGUARD: Cologuard: NEGATIVE

## 2019-11-17 MED FILL — DILTIAZEM HCL ER COATED BEA: 180 | 90 days supply | Qty: 90 | Fill #1

## 2019-11-21 ENCOUNTER — Encounter: Payer: Self-pay | Admitting: Family Medicine

## 2019-11-21 MED FILL — CYANOCOBALAMIN 1,000 MCG/ML: 1000 | 70 days supply | Qty: 2 | Fill #1

## 2019-12-02 MED FILL — ALPRAZolam 0.5 MG TABS: 0.5 | 30 days supply | Qty: 30 | Fill #1

## 2019-12-02 MED FILL — ALBUTEROL SULFATE HFA 108 (: 108 (90 BAS | 25 days supply | Qty: 18 | Fill #1

## 2019-12-20 MED FILL — SIMVASTATIN 20 MG TABLET: 20 | 90 days supply | Qty: 90 | Fill #1

## 2019-12-20 MED FILL — TRIAMCINOLONE 0.1% CREAM: 0.1 | 14 days supply | Qty: 80 | Fill #1

## 2019-12-20 MED FILL — SYNTHROID 112 MCG TABLET: 112 | 90 days supply | Qty: 90 | Fill #1

## 2020-01-26 MED FILL — CYANOCOBALAMIN 1,000 MCG/ML: 1000 | 70 days supply | Qty: 2 | Fill #2

## 2020-01-26 MED FILL — ALPRAZolam 0.5 MG TABS: 0.5 | 30 days supply | Qty: 30 | Fill #2

## 2020-02-01 DIAGNOSIS — Z1231 Encounter for screening mammogram for malignant neoplasm of breast: Secondary | ICD-10-CM | POA: Diagnosis not present

## 2020-02-01 DIAGNOSIS — Z6833 Body mass index (BMI) 33.0-33.9, adult: Secondary | ICD-10-CM | POA: Diagnosis not present

## 2020-02-01 DIAGNOSIS — R319 Hematuria, unspecified: Secondary | ICD-10-CM | POA: Diagnosis not present

## 2020-02-01 DIAGNOSIS — M8588 Other specified disorders of bone density and structure, other site: Secondary | ICD-10-CM | POA: Diagnosis not present

## 2020-02-01 DIAGNOSIS — Z01419 Encounter for gynecological examination (general) (routine) without abnormal findings: Secondary | ICD-10-CM | POA: Diagnosis not present

## 2020-02-01 DIAGNOSIS — Z13820 Encounter for screening for osteoporosis: Secondary | ICD-10-CM | POA: Diagnosis not present

## 2020-02-06 MED FILL — ALBUTEROL SULFATE HFA 108 (: 108 (90 BAS | 25 days supply | Qty: 18 | Fill #2

## 2020-02-23 ENCOUNTER — Telehealth (INDEPENDENT_AMBULATORY_CARE_PROVIDER_SITE_OTHER): Payer: 59 | Admitting: Family Medicine

## 2020-02-23 ENCOUNTER — Other Ambulatory Visit: Payer: Self-pay | Admitting: Family Medicine

## 2020-02-23 ENCOUNTER — Telehealth: Payer: 59 | Admitting: Family Medicine

## 2020-02-23 ENCOUNTER — Telehealth: Payer: Self-pay | Admitting: Family Medicine

## 2020-02-23 DIAGNOSIS — J310 Chronic rhinitis: Secondary | ICD-10-CM | POA: Diagnosis not present

## 2020-02-23 DIAGNOSIS — J302 Other seasonal allergic rhinitis: Secondary | ICD-10-CM | POA: Diagnosis not present

## 2020-02-23 DIAGNOSIS — J4521 Mild intermittent asthma with (acute) exacerbation: Secondary | ICD-10-CM

## 2020-02-23 DIAGNOSIS — J329 Chronic sinusitis, unspecified: Secondary | ICD-10-CM | POA: Diagnosis not present

## 2020-02-23 MED ORDER — DOXYCYCLINE HYCLATE 100 MG PO TABS: 100.0000 mg | ORAL_TABLET | Freq: Two times a day (BID) | ORAL | 0 refills | Status: DC

## 2020-02-23 MED FILL — DOXYCYCLINE HYCLATE 100 MG: 100 | 7 days supply | Qty: 14 | Fill #0

## 2020-02-23 NOTE — Progress Notes (Signed)
Virtual Visit via Video Note  I connected with Kristin Leonard  on 02/23/20 at  1:00 PM EDT by a video enabled telemedicine application and verified that I am speaking with the correct person using two identifiers.  Location patient: home Location provider:work or home office Persons participating in the virtual visit: patient, provider  I discussed the limitations of evaluation and management by telemedicine and the availability of in person appointments. The patient expressed understanding and agreed to proceed.   HPI:  Acute visit for sinus issues: -started about 5 days ago -symptoms include sinus congestion, headache, sinus pressure -she start flonase, claritin and over the counter cold and sinus pill when the symptoms started -she was visiting grandchildren a few days prior to symptom sand they were sick with similar -she has had very mild SOB a few times and used her alb which resolved the issues -she has mild intermittent asthma -denies persistent SOB, CP, fever, body aches, loss of taste or smell -she is > 1 month  out from her 2nd COVID19 vaccine -reports is very prone to sinus infectiions   ROS: See pertinent positives and negatives per HPI.  Past Medical History:  Diagnosis Date  . ASTHMA 04/26/2007  . CORONARY ARTERY SPASM 04/26/2007  . HYPOTHYROIDISM 02/07/2010  . NECK PAIN 02/01/2008  . SUPRAVENTRICULAR TACHYCARDIA, HX OF 04/26/2007    Past Surgical History:  Procedure Laterality Date  . BREAST SURGERY     biopsy  . LASER ABLATION     x2  . TUBAL LIGATION      No family history on file.  SOCIAL HX: see hpi   Current Outpatient Medications:  .  albuterol (VENTOLIN HFA) 108 (90 Base) MCG/ACT inhaler, INHALE 2 PUFFS BY MOUTH EVERY 6 HOURS AS NEEDED FOR WHEEZING, Disp: 18 g, Rfl: 4 .  ALPRAZolam (XANAX) 0.5 MG tablet, TAKE 1 TABLET (0.5 MG TOTAL) BY MOUTH DAILY AS NEEDED., Disp: 30 tablet, Rfl: 2 .  budesonide-formoterol (SYMBICORT) 160-4.5 MCG/ACT inhaler, Inhale 2  puffs into the lungs 2 (two) times daily., Disp: 1 Inhaler, Rfl: 3 .  CARTIA XT 180 MG 24 hr capsule, TAKE 1 CAPSULE BY MOUTH DAILY., Disp: 90 capsule, Rfl: 1 .  cyanocobalamin (,VITAMIN B-12,) 1000 MCG/ML injection, Inject 1 mL (1,000 mcg total) into the muscle every 5 (five) weeks., Disp: 3 mL, Rfl: 2 .  doxycycline (VIBRA-TABS) 100 MG tablet, Take 1 tablet (100 mg total) by mouth 2 (two) times daily., Disp: 14 tablet, Rfl: 0 .  meclizine (ANTIVERT) 25 MG tablet, Take 1 tablet (25 mg total) by mouth 3 (three) times daily as needed for dizziness., Disp: 45 tablet, Rfl: 0 .  propranolol (INDERAL) 40 MG tablet, Take 1 tablet (40 mg total) by mouth 3 (three) times daily as needed., Disp: 90 tablet, Rfl: 1 .  rosuvastatin (CRESTOR) 10 MG tablet, Take 1 tablet (10 mg total) by mouth daily., Disp: 90 tablet, Rfl: 3 .  simvastatin (ZOCOR) 20 MG tablet, Take 1 tablet (20 mg total) by mouth at bedtime., Disp: 90 tablet, Rfl: 3 .  SYNTHROID 112 MCG tablet, Take 1 tablet (112 mcg total) by mouth daily., Disp: 90 tablet, Rfl: 1 .  triamcinolone cream (KENALOG) 0.1 %, APPLY TO THE AFFECTED AREA(S) ON SKIN TWICE A DAY AS DIRECTED FOR 1 TO 2 WEEKS AS NEEDED FOR FLARE, Disp: , Rfl: 2  EXAM:  VITALS per patient if applicable:  GENERAL: alert, oriented, appears well and in no acute distress  HEENT: atraumatic, conjunttiva clear, no obvious  abnormalities on inspection of external nose and ears  NECK: normal movements of the head and neck  LUNGS: on inspection no signs of respiratory distress, breathing rate appears normal, no obvious gross SOB, gasping or wheezing  CV: no obvious cyanosis  MS: moves all visible extremities without noticeable abnormality  PSYCH/NEURO: pleasant and cooperative, no obvious depression or anxiety, speech and thought processing grossly intact  ASSESSMENT AND PLAN:  Discussed the following assessment and plan:  Rhinosinusitis  Seasonal allergies  Mild intermittent asthma  with exacerbation  -we discussed possible serious and likely etiologies, options for evaluation and workup, limitations of telemedicine visit vs in person visit, treatment, treatment risks and precautions. Pt prefers to treat via telemedicine empirically rather then risking or undertaking an in person visit at this moment. Suspect viral URI with mild asthma exacerbation vs developing sinusitis vs other vs combo with allergic rhinitis/bronchitis. Less likely COVID19 given fully vaccinated, though discussed COVID19 testing options/limitations home isolation when sick and precuations. She has opted for nasal saline, otc analgesic as needed per instructions, continued allergy regiment, alb prn and delayed abx doxy 100mg  bid x 7 days.  Patient agrees to seek prompt in person care if worsening, new symptoms arise, or if is not improving with treatment.   I discussed the assessment and treatment plan with the patient. The patient was provided an opportunity to ask questions and all were answered. The patient agreed with the plan and demonstrated an understanding of the instructions.   The patient was advised to call back or seek an in-person evaluation if the symptoms worsen or if the condition fails to improve as anticipated.   Kristin Kern, DO

## 2020-02-23 NOTE — Telephone Encounter (Signed)
Error

## 2020-02-24 ENCOUNTER — Other Ambulatory Visit: Payer: Self-pay | Admitting: Family Medicine

## 2020-02-24 DIAGNOSIS — F419 Anxiety disorder, unspecified: Secondary | ICD-10-CM

## 2020-02-24 MED FILL — ALPRAZolam 0.5 MG TABS: 0.5 | 30 days supply | Qty: 30 | Fill #0

## 2020-02-24 MED FILL — DILTIAZEM HCL ER COATED BEA: 180 | 90 days supply | Qty: 90 | Fill #0

## 2020-03-20 ENCOUNTER — Other Ambulatory Visit: Payer: Self-pay

## 2020-03-20 ENCOUNTER — Other Ambulatory Visit (INDEPENDENT_AMBULATORY_CARE_PROVIDER_SITE_OTHER): Payer: 59

## 2020-03-20 DIAGNOSIS — E538 Deficiency of other specified B group vitamins: Secondary | ICD-10-CM | POA: Diagnosis not present

## 2020-03-20 DIAGNOSIS — E039 Hypothyroidism, unspecified: Secondary | ICD-10-CM | POA: Diagnosis not present

## 2020-03-20 DIAGNOSIS — E785 Hyperlipidemia, unspecified: Secondary | ICD-10-CM

## 2020-03-20 LAB — LIPID PANEL
Cholesterol: 157 mg/dL (ref 0–200)
HDL: 52.1 mg/dL (ref >39.00)
NonHDL: 105.33
Total CHOL/HDL Ratio: 3
Triglycerides: 221 mg/dL — ABNORMAL HIGH (ref 0.0–149.0)
VLDL: 44.2 mg/dL — ABNORMAL HIGH (ref 0.0–40.0)

## 2020-03-20 LAB — LDL CHOLESTEROL, DIRECT: Direct LDL: 76 mg/dL

## 2020-03-21 LAB — VITAMIN B12: Vitamin B-12: 484 pg/mL (ref 211–911)

## 2020-03-21 LAB — TSH: TSH: 1.2 u[IU]/mL (ref 0.35–4.50)

## 2020-03-23 ENCOUNTER — Other Ambulatory Visit: Payer: Self-pay | Admitting: Family Medicine

## 2020-03-23 ENCOUNTER — Telehealth (INDEPENDENT_AMBULATORY_CARE_PROVIDER_SITE_OTHER): Payer: 59 | Admitting: Family Medicine

## 2020-03-23 ENCOUNTER — Encounter: Payer: Self-pay | Admitting: Family Medicine

## 2020-03-23 VITALS — Ht 64.0 in

## 2020-03-23 DIAGNOSIS — E538 Deficiency of other specified B group vitamins: Secondary | ICD-10-CM | POA: Diagnosis not present

## 2020-03-23 DIAGNOSIS — E785 Hyperlipidemia, unspecified: Secondary | ICD-10-CM

## 2020-03-23 DIAGNOSIS — I471 Supraventricular tachycardia: Secondary | ICD-10-CM | POA: Diagnosis not present

## 2020-03-23 DIAGNOSIS — E890 Postprocedural hypothyroidism: Secondary | ICD-10-CM

## 2020-03-23 DIAGNOSIS — F419 Anxiety disorder, unspecified: Secondary | ICD-10-CM | POA: Diagnosis not present

## 2020-03-23 DIAGNOSIS — R7989 Other specified abnormal findings of blood chemistry: Secondary | ICD-10-CM

## 2020-03-23 DIAGNOSIS — R945 Abnormal results of liver function studies: Secondary | ICD-10-CM

## 2020-03-23 MED ORDER — SYNTHROID 112 MCG PO TABS: 112.0000 ug | ORAL_TABLET | Freq: Every day | ORAL | 3 refills | Status: DC

## 2020-03-23 MED ORDER — ALPRAZOLAM 0.5 MG PO TABS: ORAL_TABLET | ORAL | 2 refills | Status: DC

## 2020-03-23 MED ORDER — PROPRANOLOL HCL 40 MG PO TABS: 40.0000 mg | ORAL_TABLET | Freq: Three times a day (TID) | ORAL | 1 refills | Status: DC | PRN

## 2020-03-23 MED FILL — PROPRANOLOL 40 MG TABLET: 40 | 30 days supply | Qty: 90 | Fill #0

## 2020-03-23 MED FILL — SYNTHROID 112 MCG TABLET: 112 | 90 days supply | Qty: 90 | Fill #0

## 2020-03-23 NOTE — Progress Notes (Signed)
Virtual Visit via Video Note   I connected with Kristin Leonard on 03/23/20 by a video enabled telemedicine application and verified that I am speaking with the correct person using two identifiers.  Location patient: home Location provider:work office Persons participating in the virtual visit: patient, provider  I discussed the limitations of evaluation and management by telemedicine and the availability of in person appointments. The patient expressed understanding and agreed to proceed.   HPI: Kristin Leonard is a 69 yo female following on some of her chronic medical problems and to review results of recent labs.  HLD, she is on Crestor 10 mg daily. Following low fat diet.  Lab Results  Component Value Date   CHOL 157 03/20/2020   HDL 52.10 03/20/2020   LDLCALC 104 11/15/2015   LDLDIRECT 76.0 03/20/2020   TRIG 221.0 (H) 03/20/2020   CHOLHDL 3 03/20/2020   Has had abnormal LFT's. Denies abdominal pain, nausea, vomiting, changes in bowel habits, or jaundice.  Lab Results  Component Value Date   ALT 34 09/16/2019   AST 32 09/16/2019   ALKPHOS 118 (H) 09/16/2019   BILITOT 0.8 09/16/2019   Hypothyroidism: She is on Synthroid 112 mcg daily. She has not noted dysphagia, palpitations, tremor, cold/heat intolerance, or abnormal weight loss.  Lab Results  Component Value Date   TSH 1.20 03/20/2020   Anxiety: She takes Alprazolam 0.5 mg daily as needed. She takes med about 1-2 times per week. Negative for depressed mood.  PSVT: She is on Diltiazem 180 mg daily. She takes Propranolol 40 mg tid prn, she doe snot have episodes frequently.  Takotsubo cardiomyopathy x 2, 40 and 69 yo.  B12 deficiency: B12 supplementation 1000 mcg q 5 weeks.  Lab Results  Component Value Date   [id number] 03/20/2020   ROS: See pertinent positives and negatives per HPI.  Past Medical History:  Diagnosis Date  . ASTHMA 04/26/2007  . CORONARY ARTERY SPASM 04/26/2007  . HYPOTHYROIDISM 02/07/2010   . NECK PAIN 02/01/2008  . SUPRAVENTRICULAR TACHYCARDIA, HX OF 04/26/2007    Past Surgical History:  Procedure Laterality Date  . BREAST SURGERY     biopsy  . LASER ABLATION     x2  . TUBAL LIGATION     History reviewed. No pertinent family history.  Social History   Socioeconomic History  . Marital status: Married    Spouse name: Not on file  . Number of children: Not on file  . Years of education: Not on file  . Highest education level: Not on file  Occupational History  . Not on file  Tobacco Use  . Smoking status: Never Smoker  . Smokeless tobacco: Never Used  Substance and Sexual Activity  . Alcohol use: Yes    Alcohol/week: 3.0 standard drinks    Types: 3 Glasses of wine per week  . Drug use: No  . Sexual activity: Not on file  Other Topics Concern  . Not on file  Social History Narrative  . Not on file   Social Determinants of Health   Financial Resource Strain:   . Difficulty of Paying Living Expenses:   Food Insecurity:   . Worried About Charity fundraiser in the Last Year:   . Arboriculturist in the Last Year:   Transportation Needs:   . Film/video editor (Medical):   Marland Kitchen Lack of Transportation (Non-Medical):   Physical Activity:   . Days of Exercise per Week:   . Minutes of Exercise per  Session:   Stress:   . Feeling of Stress :   Social Connections:   . Frequency of Communication with Friends and Family:   . Frequency of Social Gatherings with Friends and Family:   . Attends Religious Services:   . Active Member of Clubs or Organizations:   . Attends Archivist Meetings:   Marland Kitchen Marital Status:   Intimate Partner Violence:   . Fear of Current or Ex-Partner:   . Emotionally Abused:   Marland Kitchen Physically Abused:   . Sexually Abused:     Current Outpatient Medications:  .  albuterol (VENTOLIN HFA) 108 (90 Base) MCG/ACT inhaler, INHALE 2 PUFFS BY MOUTH EVERY 6 HOURS AS NEEDED FOR WHEEZING, Disp: 18 g, Rfl: 4 .  ALPRAZolam (XANAX) 0.5 MG  tablet, TAKE 1 TABLET BY MOUTH ONCE DAILY AS NEEDED (FOLLOW UP REQUIRED WHEN REFILLS RUN OUT), Disp: 30 tablet, Rfl: 2 .  cyanocobalamin (,VITAMIN B-12,) 1000 MCG/ML injection, Inject 1 mL (1,000 mcg total) into the muscle every 5 (five) weeks., Disp: 3 mL, Rfl: 2 .  diltiazem (CARTIA XT) 180 MG 24 hr capsule, Cartia XT 180 mg capsule,extended release, Disp: , Rfl:  .  Lifitegrast (XIIDRA) 5 % SOLN, Xiidra 5 % eye drops in a dropperette, Disp: , Rfl:  .  loteprednol (LOTEMAX) 0.5 % ophthalmic suspension, loteprednol etabonate 0.5 % eye drops,suspension, Disp: , Rfl:  .  meclizine (ANTIVERT) 25 MG tablet, Take 1 tablet (25 mg total) by mouth 3 (three) times daily as needed for dizziness., Disp: 45 tablet, Rfl: 0 .  propranolol (INDERAL) 40 MG tablet, Take 1 tablet (40 mg total) by mouth 3 (three) times daily as needed., Disp: 90 tablet, Rfl: 1 .  SYNTHROID 112 MCG tablet, Take 1 tablet (112 mcg total) by mouth daily., Disp: 90 tablet, Rfl: 3 .  triamcinolone cream (KENALOG) 0.1 %, APPLY TO THE AFFECTED AREA(S) ON SKIN TWICE A DAY AS DIRECTED FOR 1 TO 2 WEEKS AS NEEDED FOR FLARE, Disp: , Rfl: 2 .  rosuvastatin (CRESTOR) 10 MG tablet, Take 1 tablet (10 mg total) by mouth daily., Disp: 90 tablet, Rfl: 3  EXAM:  VITALS per patient if applicable:Ht 5\' 4"  (1.626 m)   BMI 33.51 kg/m   GENERAL: alert, oriented, appears well and in no acute distress  HEENT: atraumatic, conjunctiva clear, no obvious abnormalities on inspection.  NECK: normal movements of the head and neck  LUNGS: on inspection no signs of respiratory distress, breathing rate appears normal, no obvious gross SOB, gasping or wheezing  CV: no obvious cyanosis  Kristin: moves all visible extremities without noticeable abnormality  PSYCH/NEURO: pleasant and cooperative, no obvious depression or anxiety, speech and thought processing grossly intact  ASSESSMENT AND PLAN:  Discussed the following assessment and plan:  Hyperlipidemia,  unspecified hyperlipidemia type Improved. Continue Crestor 10 mg daily and low fat diet. F/U a annually.  Paroxysmal supraventricular tachycardia (HCC) - Plan: propranolol (INDERAL) 40 MG tablet Problem is well controlled. No changes in current management. Instructed to monitor BP and HR regularly.  Anxiety disorder, unspecified type - Plan: ALPRAZolam (XANAX) 0.5 MG tablet Stable. Sent Rx for Xanax # 30/2, instructed to arrange f/u appt if she needs more refills.  B12 deficiency Well controlled. Continue B122 1000 mcg q 5 weeks.  Postablative hypothyroidism Well controlled. Continue Synthroid 112 mcg daily. F/U in a year.  Abnormal liver function test Intermittent since 2018, improving. We will re-check with next blood work,before if needed.  I think she can  continue following annually,before if new problem or concern.   I discussed the assessment and treatment plan with the patient. Kristin Leonard was provided an opportunity to ask questions and all were answered. She agreed with the plan and demonstrated an understanding of the instructions.   The patient was advised to call back or seek an in-person evaluation if the symptoms worsen or if the condition fails to improve as anticipated.  Return in about 1 year (around 03/23/2021) for cpe.    Robinn Overholt Martin[place], MD

## 2020-03-24 ENCOUNTER — Encounter: Payer: Self-pay | Admitting: Family Medicine

## 2020-03-24 MED ORDER — ROSUVASTATIN CALCIUM 10 MG PO TABS: 10.0000 mg | ORAL_TABLET | Freq: Every day | ORAL | 3 refills | Status: DC

## 2020-03-26 ENCOUNTER — Other Ambulatory Visit: Payer: Self-pay | Admitting: *Deleted

## 2020-03-26 MED FILL — ALPRAZolam 0.5 MG TABS: 0.5 | 30 days supply | Qty: 30 | Fill #0

## 2020-03-26 MED FILL — ROSUVASTATIN CALCIUM 10 MG: 10 | 90 days supply | Qty: 90 | Fill #0

## 2020-03-26 NOTE — Telephone Encounter (Signed)
Pt stated she went to the pharmacy to pick up her medication Simvastatin and instead received Crestor. Pt stated she has updated her medication list previously b/c the same thing happened before . She would like the Crestor removed from her current med list and needs Simvastatin 20 mg called into the pharmacy.  Medication Refill:  Simvastatin   Pharmacy: [facility]  FAX: [phone]

## 2020-03-27 ENCOUNTER — Other Ambulatory Visit (HOSPITAL_COMMUNITY): Payer: Self-pay | Admitting: Optometry

## 2020-03-27 DIAGNOSIS — H524 Presbyopia: Secondary | ICD-10-CM | POA: Diagnosis not present

## 2020-03-27 NOTE — Telephone Encounter (Signed)
Pt returned the call and would like to have a return call back on her cell phone please it had been updated in the chart.

## 2020-03-27 NOTE — Telephone Encounter (Signed)
Per last lab note by Dr. Martinique "-Cholesterol elevated and getting worse. I recommend pharmacologic treatment, Simvastatin 20 mg daily and continue low fat diet. We will need to follow in 4 months."  Pt had a f/u visit 03/2020 for hyperlipidemia.   Ok to d/c Crestor and send in [place]?

## 2020-03-28 ENCOUNTER — Other Ambulatory Visit: Payer: Self-pay | Admitting: *Deleted

## 2020-03-28 DIAGNOSIS — H8110 Benign paroxysmal vertigo, unspecified ear: Secondary | ICD-10-CM

## 2020-03-28 MED ORDER — SIMVASTATIN 20 MG PO TABS
20.0000 mg | ORAL_TABLET | Freq: Every day | ORAL | 3 refills | Status: DC
Start: 2020-03-28 — End: 2020-03-28

## 2020-03-28 MED FILL — SIMVASTATIN 20 MG TABLET: 20 | 90 days supply | Qty: 90 | Fill #0

## 2020-03-28 NOTE — Telephone Encounter (Signed)
Spoke with patient and she stated that she has never taking Crestor, she has always taking Simvastatin. Simvastatin 20 mg sent to the pharmacy.

## 2020-03-29 MED FILL — LOTEPREDNOL ETABONATE 0.5 %: 0.5 | 16 days supply | Qty: 5 | Fill #0

## 2020-04-05 MED FILL — CYANOCOBALAMIN 1,000 MCG/ML: 1000 | 70 days supply | Qty: 2 | Fill #3

## 2020-05-22 MED FILL — AMOX-CLAV 875-125 MG TABLET: 875-125 | 10 days supply | Qty: 20 | Fill #0

## 2020-06-07 MED FILL — DILTIAZEM HCL ER COATED BEA: 180 | 90 days supply | Qty: 90 | Fill #1

## 2020-06-07 MED FILL — ALPRAZolam 0.5 MG TABS: 0.5 | 30 days supply | Qty: 30 | Fill #1

## 2020-06-20 MED FILL — CYANOCOBALAMIN 1,000 MCG/ML: 1000 | 35 days supply | Qty: 1 | Fill #4

## 2020-07-05 MED FILL — SYNTHROID 112 MCG TABLET: 112 | 90 days supply | Qty: 90 | Fill #1

## 2020-07-05 MED FILL — SIMVASTATIN 20 MG TABLET: 20 | 90 days supply | Qty: 90 | Fill #1

## 2020-07-05 MED FILL — ALBUTEROL SULFATE HFA 108 (: 108 (90 BAS | 25 days supply | Qty: 18 | Fill #3

## 2020-07-05 MED FILL — AMOX-CLAV 875-125 MG TABLET: 875-125 | 10 days supply | Qty: 20 | Fill #1

## 2020-07-06 ENCOUNTER — Other Ambulatory Visit: Payer: Self-pay | Admitting: Family Medicine

## 2020-07-06 DIAGNOSIS — E538 Deficiency of other specified B group vitamins: Secondary | ICD-10-CM

## 2020-07-11 DIAGNOSIS — L819 Disorder of pigmentation, unspecified: Secondary | ICD-10-CM | POA: Diagnosis not present

## 2020-07-11 DIAGNOSIS — D225 Melanocytic nevi of trunk: Secondary | ICD-10-CM | POA: Diagnosis not present

## 2020-07-11 DIAGNOSIS — D485 Neoplasm of uncertain behavior of skin: Secondary | ICD-10-CM | POA: Diagnosis not present

## 2020-07-11 DIAGNOSIS — L821 Other seborrheic keratosis: Secondary | ICD-10-CM | POA: Diagnosis not present

## 2020-07-11 DIAGNOSIS — D224 Melanocytic nevi of scalp and neck: Secondary | ICD-10-CM | POA: Diagnosis not present

## 2020-07-11 DIAGNOSIS — D1801 Hemangioma of skin and subcutaneous tissue: Secondary | ICD-10-CM | POA: Diagnosis not present

## 2020-07-11 DIAGNOSIS — D2271 Melanocytic nevi of right lower limb, including hip: Secondary | ICD-10-CM | POA: Diagnosis not present

## 2020-07-11 DIAGNOSIS — D2371 Other benign neoplasm of skin of right lower limb, including hip: Secondary | ICD-10-CM | POA: Diagnosis not present

## 2020-07-12 MED FILL — ALPRAZolam 0.5 MG TABS: 0.5 | 30 days supply | Qty: 30 | Fill #2

## 2020-07-18 MED FILL — CYANOCOBALAMIN 1,000 MCG/ML: 1000 | 35 days supply | Qty: 1 | Fill #0

## 2020-07-30 DIAGNOSIS — Z23 Encounter for immunization: Secondary | ICD-10-CM | POA: Diagnosis not present

## 2020-08-20 ENCOUNTER — Other Ambulatory Visit: Payer: Self-pay | Admitting: Family Medicine

## 2020-08-20 ENCOUNTER — Encounter: Payer: Self-pay | Admitting: Family Medicine

## 2020-08-20 ENCOUNTER — Other Ambulatory Visit: Payer: Self-pay

## 2020-08-20 ENCOUNTER — Telehealth (INDEPENDENT_AMBULATORY_CARE_PROVIDER_SITE_OTHER): Payer: 59 | Admitting: Family Medicine

## 2020-08-20 VITALS — Ht 64.0 in

## 2020-08-20 DIAGNOSIS — E538 Deficiency of other specified B group vitamins: Secondary | ICD-10-CM | POA: Diagnosis not present

## 2020-08-20 DIAGNOSIS — J45901 Unspecified asthma with (acute) exacerbation: Secondary | ICD-10-CM

## 2020-08-20 DIAGNOSIS — J069 Acute upper respiratory infection, unspecified: Secondary | ICD-10-CM

## 2020-08-20 MED ORDER — HYDROCODONE-HOMATROPINE 5-1.5 MG/5ML PO SYRP: 5.0000 mL | ORAL_SOLUTION | Freq: Two times a day (BID) | ORAL | 0 refills | Status: DC | PRN

## 2020-08-20 MED ORDER — PREDNISONE 20 MG PO TABS: 40.0000 mg | ORAL_TABLET | Freq: Every day | ORAL | 0 refills | Status: DC

## 2020-08-20 MED ORDER — CYANOCOBALAMIN 1000 MCG/ML IJ SOLN: INTRAMUSCULAR | 3 refills | Status: DC

## 2020-08-20 MED ORDER — ALBUTEROL SULFATE HFA 108 (90 BASE) MCG/ACT IN AERS: INHALATION_SPRAY | RESPIRATORY_TRACT | 4 refills | Status: DC

## 2020-08-20 MED ORDER — BENZONATATE 100 MG PO CAPS: 200.0000 mg | ORAL_CAPSULE | Freq: Two times a day (BID) | ORAL | 0 refills | Status: DC | PRN

## 2020-08-20 MED FILL — predniSONE 20 MG TABS: 20 | 10 days supply | Qty: 10 | Fill #0

## 2020-08-20 MED FILL — ALBUTEROL SULFATE HFA 108 (: 108 (90 BAS | 25 days supply | Qty: 18 | Fill #0

## 2020-08-20 MED FILL — BENZONATATE 100 MG CAPS: 100 | 10 days supply | Qty: 40 | Fill #0

## 2020-08-20 MED FILL — HYDROCODONE-HOMATROPINE SYR: 5-1.5 | 10 days supply | Qty: 100 | Fill #0

## 2020-08-20 MED FILL — CYANOCOBALAMIN 1,000 MCG/ML: 1000 | 32 days supply | Qty: 1 | Fill #0

## 2020-08-20 NOTE — Progress Notes (Signed)
Virtual Visit via Telephone Note  I connected with [name] on 08/20/20 at  4:30 PM EDT by telephone and verified that I am speaking with the correct person using two identifiers.   I discussed the limitations, risks, security and privacy concerns of performing an evaluation and management service by telephone and the availability of in person appointments. I also discussed with the patient that there may be a patient responsible charge related to this service. The patient expressed understanding and agreed to proceed.  Location patient: home Location provider: work office Participants present for the call: patient, provider Patient did not have a visit in the prior 7 days to address this/these issue(s).  History of Present Illness: Ms. Hink is a 69 yo female with Hx of hypothyroidism,mitral valve prolapse,B12 deficiency,anxiety,and asthma  c/o 6 days of respiratory symptoms. Her husband and grandchildren are also recovering from URI. She had COVID 19 test done and it is negative. "Little" SOB and wheezing, exacerbated by exertion. Productive cough, interfering with sleep. She is not able to bring sputum up.  Frontal pressure pain,nasal congestion,post nasal drainage,and rhinorrhea. Robitussin DM has not helped much. Albuterol inh q 4 hours, it helps some. She is requesting Prednisone , which has helped with similar symptoms in the past.  No fever,chills,no body aches, abdominal pain,N/V,changes in bowel movements, or skin rash.  She is also requesting refills on B12 . She is on B12 1000 mcg q 5 weeks.  Lab Results  Component Value Date   [id number] 03/20/2020    Observations/Objective: Patient sounds cheerful and well on the phone. I do not appreciate any SOB or wheezing. Productive cough a couple times during visit. Speech and thought processing are grossly intact. Patient reported vitals:Ht 5\' 4"  (1.626 m)   BMI 33.51 kg/m   Assessment and Plan:  1.  Asthma exacerbation, mild She has tolerated prednisone before, recommend 40 mg daily with breakfast for 3 to 5 days. Monitor for worsening symptoms. Albuterol inhaler 2 puffs every 4 hours for 7 days, then as needed. I do not think imaging needed at this time. She was clearly instructed about warning signs. We discussed some side effects of prednisone.  2. B12 deficiency Continue B12 1000 mcg IM q 5 weeks. B12 to be re-checked in 10/2020.  - cyanocobalamin (,VITAMIN B-12,) 1000 MCG/ML injection; INJECT 1 ML INTO THE MUSCLE EVERY 5 WEEKS  Dispense: 1 mL; Refill: 3 - Vitamin B12; Future  3. URI, acute Viral most likely, improving. Continue symptomatic treatment. For cough I recommended benzonatate 100 mg 1 to 2 capsules twice daily as needed and Hycodan 5 mL twice daily as needed. We discussed some side effects.  Adequate hydration. Monitor for new symptoms.  Follow Up Instructions: Return for Lab appt to check B12 in 10/2020. .  I did not refer this patient for an OV in the next 24 hours for this/these issue(s).  I discussed the assessment and treatment plan with the patient. Ms Guimaraes was provided an opportunity to ask questions and all were answered. She agreed with the plan and demonstrated an understanding of the instructions.   I provided 13 minutes of non-face-to-face time during this encounter.   Yuan Gann Martinique, MD

## 2020-09-07 ENCOUNTER — Other Ambulatory Visit: Payer: Self-pay | Admitting: Family Medicine

## 2020-09-07 DIAGNOSIS — F419 Anxiety disorder, unspecified: Secondary | ICD-10-CM

## 2020-09-10 ENCOUNTER — Other Ambulatory Visit: Payer: Self-pay | Admitting: Family Medicine

## 2020-09-10 MED FILL — ALPRAZolam 0.5 MG TABS: 0.5 | 30 days supply | Qty: 30 | Fill #0

## 2020-09-10 NOTE — Telephone Encounter (Signed)
Last filled 07/12/20 Last OV 08/2020

## 2020-09-12 ENCOUNTER — Other Ambulatory Visit: Payer: Self-pay | Admitting: Family Medicine

## 2020-09-12 MED FILL — PROPRANOLOL 40 MG TABLET: 40 | 30 days supply | Qty: 90 | Fill #1

## 2020-09-17 ENCOUNTER — Other Ambulatory Visit: Payer: Self-pay | Admitting: Family Medicine

## 2020-09-17 MED FILL — DILTIAZEM HCL ER COATED BEA: 180 | 90 days supply | Qty: 90 | Fill #0

## 2020-09-19 MED FILL — CYANOCOBALAMIN 1,000 MCG/ML: 1000 | 35 days supply | Qty: 1 | Fill #1

## 2020-10-02 MED FILL — SYNTHROID 112 MCG TABLET: 112 | 90 days supply | Qty: 90 | Fill #2

## 2020-10-02 MED FILL — LOTEPREDNOL ETABONATE 0.5 %: 0.5 | 16 days supply | Qty: 5 | Fill #1

## 2020-10-02 MED FILL — SIMVASTATIN 20 MG TABLET: 20 | 90 days supply | Qty: 90 | Fill #2

## 2020-10-19 MED FILL — CYANOCOBALAMIN 1,000 MCG/ML: 1000 | 35 days supply | Qty: 1 | Fill #2

## 2020-11-19 MED FILL — CYANOCOBALAMIN 1,000 MCG/ML: 1000 | 35 days supply | Qty: 1 | Fill #3

## 2020-12-20 ENCOUNTER — Other Ambulatory Visit: Payer: Self-pay | Admitting: Family Medicine

## 2020-12-20 DIAGNOSIS — E538 Deficiency of other specified B group vitamins: Secondary | ICD-10-CM

## 2020-12-21 ENCOUNTER — Other Ambulatory Visit: Payer: Self-pay | Admitting: Family Medicine

## 2020-12-21 MED FILL — CYANOCOBALAMIN 1,000 MCG/ML: 1000 | 35 days supply | Qty: 1 | Fill #0

## 2020-12-25 ENCOUNTER — Other Ambulatory Visit: Payer: Self-pay | Admitting: Family Medicine

## 2020-12-25 DIAGNOSIS — I471 Supraventricular tachycardia: Secondary | ICD-10-CM

## 2020-12-25 DIAGNOSIS — F419 Anxiety disorder, unspecified: Secondary | ICD-10-CM

## 2020-12-25 MED FILL — SYNTHROID 112 MCG TABLET: 112 | 90 days supply | Qty: 90 | Fill #3

## 2020-12-25 MED FILL — SIMVASTATIN 20 MG TABLET: 20 | 90 days supply | Qty: 90 | Fill #3

## 2020-12-25 MED FILL — PROPRANOLOL 40 MG TABLET: 40 | 30 days supply | Qty: 90 | Fill #0

## 2020-12-25 MED FILL — DILTIAZEM HCL ER COATED BEA: 180 | 90 days supply | Qty: 90 | Fill #1

## 2020-12-25 NOTE — Addendum Note (Signed)
Addended by: Rodrigo Ran on: 12/25/2020 03:11 PM   Modules accepted: Orders

## 2020-12-26 ENCOUNTER — Other Ambulatory Visit: Payer: Self-pay | Admitting: Family Medicine

## 2020-12-26 MED ORDER — ALPRAZOLAM 0.5 MG PO TABS: 0.2500 mg | ORAL_TABLET | Freq: Every day | ORAL | 0 refills | Status: DC | PRN

## 2020-12-26 MED FILL — ALPRAZolam 0.5 MG TABS: 0.5 | 15 days supply | Qty: 15 | Fill #0

## 2020-12-27 MED FILL — LOTEPREDNOL ETABONATE 0.5 %: 0.5 | 16 days supply | Qty: 5 | Fill #2

## 2021-02-02 ENCOUNTER — Other Ambulatory Visit (HOSPITAL_COMMUNITY): Payer: Self-pay

## 2021-02-02 MED FILL — Cyanocobalamin Inj 1000 MCG/ML: INTRAMUSCULAR | 35 days supply | Qty: 1 | Fill #0 | Status: AC

## 2021-02-02 MED FILL — Albuterol Sulfate Inhal Aero 108 MCG/ACT (90MCG Base Equiv): RESPIRATORY_TRACT | 28 days supply | Qty: 18 | Fill #0 | Status: AC

## 2021-02-04 ENCOUNTER — Other Ambulatory Visit (HOSPITAL_COMMUNITY): Payer: Self-pay

## 2021-03-05 ENCOUNTER — Other Ambulatory Visit (HOSPITAL_COMMUNITY): Payer: Self-pay

## 2021-03-05 MED FILL — Cyanocobalamin Inj 1000 MCG/ML: INTRAMUSCULAR | 35 days supply | Qty: 1 | Fill #1 | Status: AC

## 2021-03-07 ENCOUNTER — Other Ambulatory Visit: Payer: Self-pay | Admitting: Family Medicine

## 2021-03-07 ENCOUNTER — Other Ambulatory Visit (HOSPITAL_COMMUNITY): Payer: Self-pay

## 2021-03-07 DIAGNOSIS — F419 Anxiety disorder, unspecified: Secondary | ICD-10-CM

## 2021-03-08 ENCOUNTER — Encounter: Payer: Self-pay | Admitting: Family Medicine

## 2021-03-08 ENCOUNTER — Other Ambulatory Visit (HOSPITAL_COMMUNITY): Payer: Self-pay

## 2021-03-11 ENCOUNTER — Other Ambulatory Visit (HOSPITAL_COMMUNITY): Payer: Self-pay

## 2021-03-11 ENCOUNTER — Other Ambulatory Visit: Payer: Self-pay | Admitting: Family Medicine

## 2021-03-11 DIAGNOSIS — F419 Anxiety disorder, unspecified: Secondary | ICD-10-CM

## 2021-03-11 NOTE — Telephone Encounter (Signed)
Last filled 12/26/20, can you refill in pcp's absence?

## 2021-03-11 NOTE — Telephone Encounter (Signed)
Last filled 12/26/20, can you refill in pcp's absence? 

## 2021-03-12 ENCOUNTER — Other Ambulatory Visit (HOSPITAL_COMMUNITY): Payer: Self-pay

## 2021-03-12 MED ORDER — ALPRAZOLAM 0.5 MG PO TABS
ORAL_TABLET | ORAL | 0 refills | Status: DC
  Filled 2021-03-12: qty 30, 30d supply, fill #0

## 2021-03-26 DIAGNOSIS — Z1231 Encounter for screening mammogram for malignant neoplasm of breast: Secondary | ICD-10-CM | POA: Diagnosis not present

## 2021-03-26 DIAGNOSIS — Z6835 Body mass index (BMI) 35.0-35.9, adult: Secondary | ICD-10-CM | POA: Diagnosis not present

## 2021-03-26 DIAGNOSIS — Z01419 Encounter for gynecological examination (general) (routine) without abnormal findings: Secondary | ICD-10-CM | POA: Diagnosis not present

## 2021-03-26 DIAGNOSIS — E039 Hypothyroidism, unspecified: Secondary | ICD-10-CM | POA: Diagnosis not present

## 2021-03-26 DIAGNOSIS — D519 Vitamin B12 deficiency anemia, unspecified: Secondary | ICD-10-CM | POA: Diagnosis not present

## 2021-04-04 ENCOUNTER — Other Ambulatory Visit: Payer: Self-pay | Admitting: Family Medicine

## 2021-04-04 ENCOUNTER — Other Ambulatory Visit (HOSPITAL_COMMUNITY): Payer: Self-pay

## 2021-04-04 MED ORDER — AMOXICILLIN 500 MG PO CAPS
500.0000 mg | ORAL_CAPSULE | Freq: Three times a day (TID) | ORAL | 0 refills | Status: DC
  Filled 2021-04-04: qty 30, 10d supply, fill #0

## 2021-04-04 MED FILL — Cyanocobalamin Inj 1000 MCG/ML: INTRAMUSCULAR | 35 days supply | Qty: 1 | Fill #2 | Status: AC

## 2021-04-05 ENCOUNTER — Other Ambulatory Visit (HOSPITAL_COMMUNITY): Payer: Self-pay

## 2021-04-05 MED ORDER — DILTIAZEM HCL ER COATED BEADS 180 MG PO CP24
ORAL_CAPSULE | Freq: Every day | ORAL | 0 refills | Status: DC
  Filled 2021-04-05: qty 90, 90d supply, fill #0

## 2021-04-05 MED ORDER — SYNTHROID 112 MCG PO TABS
112.0000 ug | ORAL_TABLET | Freq: Every day | ORAL | 0 refills | Status: DC
  Filled 2021-04-05: qty 90, 90d supply, fill #0

## 2021-04-19 ENCOUNTER — Other Ambulatory Visit (HOSPITAL_COMMUNITY): Payer: Self-pay

## 2021-05-14 ENCOUNTER — Other Ambulatory Visit: Payer: Self-pay | Admitting: Family Medicine

## 2021-05-14 DIAGNOSIS — F419 Anxiety disorder, unspecified: Secondary | ICD-10-CM

## 2021-05-14 DIAGNOSIS — E538 Deficiency of other specified B group vitamins: Secondary | ICD-10-CM

## 2021-05-15 ENCOUNTER — Other Ambulatory Visit (HOSPITAL_COMMUNITY): Payer: Self-pay

## 2021-05-15 MED ORDER — CYANOCOBALAMIN 1000 MCG/ML IJ SOLN
INTRAMUSCULAR | 3 refills | Status: DC
  Filled 2021-05-15: qty 1, 30d supply, fill #0
  Filled 2021-06-17: qty 1, 30d supply, fill #1

## 2021-05-16 ENCOUNTER — Other Ambulatory Visit (HOSPITAL_COMMUNITY): Payer: Self-pay

## 2021-05-16 ENCOUNTER — Other Ambulatory Visit: Payer: Self-pay | Admitting: Family Medicine

## 2021-05-16 DIAGNOSIS — F419 Anxiety disorder, unspecified: Secondary | ICD-10-CM

## 2021-05-17 ENCOUNTER — Other Ambulatory Visit (HOSPITAL_COMMUNITY): Payer: Self-pay

## 2021-05-28 ENCOUNTER — Other Ambulatory Visit (HOSPITAL_COMMUNITY): Payer: Self-pay

## 2021-05-28 MED FILL — Albuterol Sulfate Inhal Aero 108 MCG/ACT (90MCG Base Equiv): RESPIRATORY_TRACT | 25 days supply | Qty: 18 | Fill #1 | Status: AC

## 2021-06-17 ENCOUNTER — Other Ambulatory Visit (HOSPITAL_COMMUNITY): Payer: Self-pay

## 2021-06-19 DIAGNOSIS — E559 Vitamin D deficiency, unspecified: Secondary | ICD-10-CM | POA: Diagnosis not present

## 2021-06-25 ENCOUNTER — Telehealth: Payer: 59 | Admitting: Family Medicine

## 2021-06-25 ENCOUNTER — Other Ambulatory Visit (HOSPITAL_COMMUNITY): Payer: Self-pay

## 2021-06-25 ENCOUNTER — Encounter: Payer: Self-pay | Admitting: Family Medicine

## 2021-06-25 VITALS — Ht 64.0 in

## 2021-06-25 DIAGNOSIS — E782 Mixed hyperlipidemia: Secondary | ICD-10-CM | POA: Insufficient documentation

## 2021-06-25 DIAGNOSIS — I471 Supraventricular tachycardia: Secondary | ICD-10-CM

## 2021-06-25 DIAGNOSIS — E890 Postprocedural hypothyroidism: Secondary | ICD-10-CM

## 2021-06-25 DIAGNOSIS — R945 Abnormal results of liver function studies: Secondary | ICD-10-CM

## 2021-06-25 DIAGNOSIS — F419 Anxiety disorder, unspecified: Secondary | ICD-10-CM

## 2021-06-25 DIAGNOSIS — H8110 Benign paroxysmal vertigo, unspecified ear: Secondary | ICD-10-CM | POA: Diagnosis not present

## 2021-06-25 DIAGNOSIS — E538 Deficiency of other specified B group vitamins: Secondary | ICD-10-CM | POA: Diagnosis not present

## 2021-06-25 DIAGNOSIS — R7989 Other specified abnormal findings of blood chemistry: Secondary | ICD-10-CM | POA: Insufficient documentation

## 2021-06-25 MED ORDER — ALPRAZOLAM 0.5 MG PO TABS
0.2500 mg | ORAL_TABLET | Freq: Every evening | ORAL | 3 refills | Status: DC | PRN
  Filled 2021-06-25: qty 30, 30d supply, fill #0
  Filled 2021-07-29: qty 30, 30d supply, fill #1
  Filled 2021-10-01: qty 30, 30d supply, fill #2
  Filled 2021-11-18: qty 30, 30d supply, fill #3

## 2021-06-25 MED ORDER — SIMVASTATIN 20 MG PO TABS
ORAL_TABLET | Freq: Every day | ORAL | 3 refills | Status: DC
  Filled 2021-06-25: qty 90, 90d supply, fill #0
  Filled 2021-09-30: qty 90, 90d supply, fill #1
  Filled 2022-01-11: qty 90, 90d supply, fill #2
  Filled 2022-04-01: qty 90, 90d supply, fill #3

## 2021-06-25 MED ORDER — SYNTHROID 112 MCG PO TABS
112.0000 ug | ORAL_TABLET | Freq: Every day | ORAL | 3 refills | Status: DC
  Filled 2021-06-25: qty 90, 90d supply, fill #0
  Filled 2021-09-30: qty 90, 90d supply, fill #1
  Filled 2022-01-11: qty 90, 90d supply, fill #2
  Filled 2022-04-01: qty 90, 90d supply, fill #3

## 2021-06-25 MED ORDER — ICOSAPENT ETHYL 1 G PO CAPS
1.0000 g | ORAL_CAPSULE | Freq: Two times a day (BID) | ORAL | 2 refills | Status: DC
  Filled 2021-06-25: qty 180, 90d supply, fill #0
  Filled 2021-09-30: qty 180, 90d supply, fill #1

## 2021-06-25 MED ORDER — DILTIAZEM HCL ER COATED BEADS 180 MG PO CP24
ORAL_CAPSULE | Freq: Every day | ORAL | 2 refills | Status: DC
  Filled 2021-06-25: qty 90, 90d supply, fill #0
  Filled 2021-09-30: qty 90, 90d supply, fill #1

## 2021-06-25 MED ORDER — CYANOCOBALAMIN 1000 MCG/ML IJ SOLN
INTRAMUSCULAR | 2 refills | Status: DC
Start: 2021-06-25 — End: 2022-05-26
  Filled 2021-06-25: qty 3, fill #0
  Filled 2021-07-25: qty 3, 90d supply, fill #0
  Filled 2021-11-18: qty 3, 90d supply, fill #1
  Filled 2022-02-17: qty 3, 90d supply, fill #2

## 2021-06-25 MED ORDER — PROPRANOLOL HCL 40 MG PO TABS
ORAL_TABLET | Freq: Three times a day (TID) | ORAL | 3 refills | Status: DC | PRN
  Filled 2021-06-25: qty 90, 30d supply, fill #0
  Filled 2021-11-18: qty 90, 30d supply, fill #1
  Filled 2022-04-01: qty 90, 30d supply, fill #2

## 2021-06-25 NOTE — Assessment & Plan Note (Addendum)
Some numbers went up slightly. TG went from 221 to 291. HDL went from 52 to 45. Continue simvastatin 20 mg daily and low fat diet. She agrees with adding Vascepa 1 g twice daily. Follow-up in 6 months.

## 2021-06-25 NOTE — Assessment & Plan Note (Signed)
Problem has been stable. No changes in current medications. Continue low-fat diet. F/U in 6 months.

## 2021-06-25 NOTE — Assessment & Plan Note (Addendum)
She has been asymptomatic. Continue diltiazem 180 mg daily and propranolol 40 mg 3 times daily as needed. We discussed some side effects of medications and risk of interaction with simvastatin. Instructed about warning signs. We will plan on doing an EKG next visit.

## 2021-06-25 NOTE — Progress Notes (Addendum)
MyChart Video Visit  Virtual Visit via Video Note   This visit type was conducted due to national recommendations for restrictions regarding the COVID-19 Pandemic (e.g. social distancing) in an effort to limit this patient's exposure and mitigate transmission in our community. This patient is at least at moderate risk for complications without adequate follow up. This format is felt to be most appropriate for this patient at this time. Physical exam was limited by quality of the video and audio technology used for the visit.   Patient location: Home Provider location: Office  I discussed the limitations of evaluation and management by telemedicine and the availability of in person appointments. The patient expressed understanding and agreed to proceed.  Patient: Kristin Leonard   DOB: 18-Jun-1951   70 y.o. Female  MRN: [id number] Visit Date: 06/25/2021  Today's healthcare provider: Briggette Najarian Martinique, MD   Chief Complaint  Patient presents with   Medication Refill   Gwenlyn SILVANNA PRICKETT is a 70 y.o.female with hx of vertigo, SVT, hypothyroidism, B12 deficiency, asthma, and anxiety being seen today for follow up. She was last seen on 08/20/2020 for acute visit, asthma exacerbation. Last follow-up in 03/2020.  Since her last visit she has followed with her gynecologist, she had blood work done on 03/26/2021. Anxiety and insomnia: She is on alprazolam 0.5 mg 1/2 to 1 tablet at bedtime, she takes medication almost every night. She has been on same medication since 2006. Alprazolam "calm me down", able to sleep much better. Problem is aggravated by stress.  When she takes medication she sleeps 8 to 9 hours.if she does not take Alprazolam, she wakes up around 2 AM and it is difficult to go back to sleep. She is tolerating medication well, no side effect reported. Negative for depression-like symptoms.  B12 deficiency: Currently she is on B12 1000 mcg IM every 5 weeks. Last B12 was  624.  HLD: Currently she is on simvastatin 20 mg daily. She has tolerated medication well. On 03/26/2021 TC 173, TG 291, HDL 45, and LDL 81.  Lab Results  Component Value Date   CHOL 157 03/20/2020   HDL 52.10 03/20/2020   LDLCALC 104 11/15/2015   LDLDIRECT 76.0 03/20/2020   TRIG 221.0 (H) 03/20/2020   CHOLHDL 3 03/20/2020   Hypothyroidism: Last TSH done at her gynecologist's office was 2.3. Currently she is on Synthroid 112 mcg daily.  Paroxysmal supraventricular tachycardia: She is on diltiazem 180 mg daily. She also takes propranolol 40 mg 3 times daily as needed, she does not need this medication often. Negative for CP, palpitation, dyspnea, CP, orthopnea, and PND.  Abnormal LFTs, which has improved throughout the years. Alkaline phosphatase and ALT still mildly elevated at 133 and 40 respectively. Cr 0.8 and e GFR 76. Rest of CMP in normal range.  Negative for abdominal pain, N/V, changes in bowel habits, or jaundice.  Reporting that she is still having occasional and milder episodes of vertigo when lying down that last a few seconds. Episodes are not bad enough to take meclizine but she would like to be able to request refill if needed.     Patient Active Problem List   Diagnosis Date Noted   Anxiety disorder, unspecified 09/14/2018   B12 deficiency 09/14/2018   Mitral valve prolapse 08/13/2011   Hypothyroidism 02/07/2010   CORONARY ARTERY SPASM 04/26/2007   Asthma 04/26/2007   Paroxysmal supraventricular tachycardia ([place]) 04/26/2007   Past Medical History:  Diagnosis Date   ASTHMA 04/26/2007  CORONARY ARTERY SPASM 04/26/2007   HYPOTHYROIDISM 02/07/2010   NECK PAIN 02/01/2008   SUPRAVENTRICULAR TACHYCARDIA, HX OF 04/26/2007   Social History   Tobacco Use   Smoking status: Never   Smokeless tobacco: Never  Substance Use Topics   Alcohol use: Yes    Alcohol/week: 3.0 standard drinks    Types: 3 Glasses of wine per week   Drug use: No   Allergies   Allergen Reactions   No Known Allergies    Medications: Outpatient Medications Prior to Visit  Medication Sig   albuterol (VENTOLIN HFA) 108 (90 Base) MCG/ACT inhaler INHALE 2 PUFFS BY MOUTH EVERY 6 HOURS AS NEEDED FOR WHEEZING   ALPRAZolam (XANAX) 0.5 MG tablet TAKE 1/2 TO 1 TABLET BY MOUTH DAILY AS NEEDED FOR ANXIETY.   cyanocobalamin (,VITAMIN B-12,) 1000 MCG/ML injection Inject 1 ml every 5 weeks.   diltiazem (CARDIZEM CD) 180 MG 24 hr capsule TAKE 1 CAPSULE BY MOUTH DAILY.   propranolol (INDERAL) 40 MG tablet TAKE 1 TABLET BY MOUTH 3 TIMES DAILY AS NEEDED   SYNTHROID 112 MCG tablet TAKE 1 TABLET BY MOUTH ONCE DAILY   triamcinolone cream (KENALOG) 0.1 % APPLY TO THE AFFECTED AREA(S) ON SKIN TWICE A DAY AS DIRECTED FOR 1 TO 2 WEEKS AS NEEDED FOR FLARE   simvastatin (ZOCOR) 20 MG tablet TAKE 1 TABLET BY MOUTH AT BEDTIME   [DISCONTINUED] amoxicillin (AMOXIL) 500 MG capsule Take 1 capsule (500 mg total) by mouth 3 (three) times daily until gone   [DISCONTINUED] benzonatate (TESSALON) 100 MG capsule TAKE 2 CAPSULES BY MOUTH TWO TIMES DAILY AS NEEDED UP TO 10 DAYS   [DISCONTINUED] Lifitegrast (XIIDRA) 5 % SOLN Xiidra 5 % eye drops in a dropperette   [DISCONTINUED] loteprednol (LOTEMAX) 0.5 % ophthalmic suspension loteprednol etabonate 0.5 % eye drops,suspension   [DISCONTINUED] meclizine (ANTIVERT) 25 MG tablet Take 1 tablet (25 mg total) by mouth 3 (three) times daily as needed for dizziness.   [DISCONTINUED] predniSONE (DELTASONE) 20 MG tablet TAKE 2 TABLETS BY MOUTH WITH BREAKFAST FOR 5 DAYS   No facility-administered medications prior to visit.   Review of Systems  Constitutional:  Negative for activity change, appetite change and fever.  HENT:  Negative for mouth sores and nosebleeds.   Eyes:  Negative for redness and visual disturbance.  Respiratory:  Negative for cough and wheezing.   Cardiovascular:  Negative for leg swelling.  Gastrointestinal:        Negative for changes in  bowel habits.  Endocrine: Negative for cold intolerance and heat intolerance.  Genitourinary:  Negative for decreased urine volume, dysuria and hematuria.  Musculoskeletal:  Negative for gait problem and myalgias.  Skin:  Negative for pallor and rash.  Neurological:  Negative for syncope, weakness and headaches.  Psychiatric/Behavioral:  Positive for sleep disturbance. Negative for confusion. The patient is nervous/anxious.   See pertinent positives and negatives per HPI.  Objective    Ht '5\' 4"'$  (1.626 m)   BMI 33.51 kg/m   GENERAL: alert, oriented, appears well and in no acute distress  HEENT: atraumatic, conjunctiva clear, no obvious abnormalities on inspection of external nose and ears  NECK: normal movements of the head and neck  LUNGS: on inspection no signs of respiratory distress, breathing rate appears normal, no obvious gross SOB, gasping or wheezing  CV: no obvious cyanosis  MS: moves all visible extremities without noticeable abnormality  PSYCH/NEURO: pleasant and cooperative, no obvious depression or anxiety, speech and thought processing grossly intact  Assessment & Plan    Aarti JOYICE FUDA had a virtual visit today (video).  Diagnoses and all orders for this visit:  Paroxysmal supraventricular tachycardia She has been asymptomatic. Continue diltiazem 180 mg daily and propranolol 40 mg 3 times daily as needed. We discussed some side effects of medications and risk of interaction with simvastatin. Instructed about warning signs. We will plan on doing an EKG next visit.  Hypothyroidism Problem has been well controlled. Continue Synthroid 112 mcg daily. Follow-up in 03/2022.  Anxiety disorder, unspecified Problem has been stable. She has been on same medication for years and has tolerated well, so continue alprazolam 0.5 mg 1/2 to 1 tablet daily at bedtime as needed. Follow-up in 6 months, before if needed.  B12 deficiency Problem has been a  stable. Continue B12 at 1000 mcg IM q. 5 weeks.  Vertigo, benign positional Problem has greatly improved. Continue meclizine 25 mg daily as needed.  She will call for refills when needed.  Abnormal liver function test Problem has been stable. No changes in current medications. Continue low-fat diet. F/U in 6 months.  Mixed hyperlipidemia Some numbers went up slightly. TG went from 221 to 291. HDL went from 52 to 45. Continue simvastatin 20 mg daily and low fat diet. She agrees with adding Vascepa 1 g twice daily. Follow-up in 6 months.  Return in about 6 months (around 12/26/2021) for F/U.  I discussed the assessment and treatment plan with the patient. She was provided an opportunity to ask questions and all were answered. She agreed with the plan and demonstrated an understanding of the instructions.  I spent a total of 40 minutes in both face to face (video) and non face to face activities for this visit on the date of this encounter. During this time history was obtained and documented, examination was performed, prior labs reviewed, and assessment/plan discussed.  Dionisio Aragones [place], MD [facility] (phone) [phone] (fax)  [place]

## 2021-06-25 NOTE — Assessment & Plan Note (Signed)
Problem has been well controlled. Continue Synthroid 112 mcg daily. Follow-up in 03/2022.

## 2021-06-25 NOTE — Assessment & Plan Note (Signed)
Problem has been stable. She has been on same medication for years and has tolerated well, so continue alprazolam 0.5 mg 1/2 to 1 tablet daily at bedtime as needed. Follow-up in 6 months, before if needed.

## 2021-06-25 NOTE — Assessment & Plan Note (Signed)
Problem has been a stable. Continue B12 at 1000 mcg IM q. 5 weeks.

## 2021-06-25 NOTE — Assessment & Plan Note (Signed)
Problem has greatly improved. Continue meclizine 25 mg daily as needed.  She will call for refills when needed.

## 2021-07-25 ENCOUNTER — Other Ambulatory Visit (HOSPITAL_COMMUNITY): Payer: Self-pay

## 2021-07-25 DIAGNOSIS — D225 Melanocytic nevi of trunk: Secondary | ICD-10-CM | POA: Diagnosis not present

## 2021-07-25 DIAGNOSIS — L821 Other seborrheic keratosis: Secondary | ICD-10-CM | POA: Diagnosis not present

## 2021-07-25 DIAGNOSIS — D1801 Hemangioma of skin and subcutaneous tissue: Secondary | ICD-10-CM | POA: Diagnosis not present

## 2021-07-25 DIAGNOSIS — D2272 Melanocytic nevi of left lower limb, including hip: Secondary | ICD-10-CM | POA: Diagnosis not present

## 2021-07-25 DIAGNOSIS — D2271 Melanocytic nevi of right lower limb, including hip: Secondary | ICD-10-CM | POA: Diagnosis not present

## 2021-07-25 DIAGNOSIS — L570 Actinic keratosis: Secondary | ICD-10-CM | POA: Diagnosis not present

## 2021-07-25 MED ORDER — TRIAMCINOLONE ACETONIDE 0.1 % EX CREA
TOPICAL_CREAM | CUTANEOUS | 1 refills | Status: DC
  Filled 2021-07-25: qty 80, 40d supply, fill #0

## 2021-07-29 ENCOUNTER — Other Ambulatory Visit (HOSPITAL_COMMUNITY): Payer: Self-pay

## 2021-08-30 ENCOUNTER — Encounter: Payer: Self-pay | Admitting: Family Medicine

## 2021-08-30 NOTE — Telephone Encounter (Signed)
Pt notified that there are no providers in the office that can prescribe medications while she is out of the state of ; informed that they can only practice medicine in the state they are licensed in.  Pt advised to seek care at a local UC if needed. Pt verb understanding.

## 2021-09-30 ENCOUNTER — Other Ambulatory Visit (HOSPITAL_COMMUNITY): Payer: Self-pay

## 2021-09-30 ENCOUNTER — Other Ambulatory Visit: Payer: Self-pay | Admitting: Family Medicine

## 2021-09-30 DIAGNOSIS — H524 Presbyopia: Secondary | ICD-10-CM | POA: Diagnosis not present

## 2021-09-30 MED ORDER — ALBUTEROL SULFATE HFA 108 (90 BASE) MCG/ACT IN AERS
INHALATION_SPRAY | RESPIRATORY_TRACT | 4 refills | Status: DC
  Filled 2021-09-30: qty 18, 25d supply, fill #0
  Filled 2022-01-11: qty 18, 25d supply, fill #1
  Filled 2022-06-15: qty 18, 25d supply, fill #2
  Filled 2022-07-23: qty 18, 25d supply, fill #3
  Filled 2022-09-28: qty 6.7, 25d supply, fill #4

## 2021-10-01 ENCOUNTER — Other Ambulatory Visit (HOSPITAL_COMMUNITY): Payer: Self-pay

## 2021-10-02 ENCOUNTER — Other Ambulatory Visit (HOSPITAL_COMMUNITY): Payer: Self-pay

## 2021-10-03 ENCOUNTER — Other Ambulatory Visit (HOSPITAL_COMMUNITY): Payer: Self-pay

## 2021-10-03 MED ORDER — LOTEMAX SM 0.38 % OP GEL
OPHTHALMIC | 1 refills | Status: DC
  Filled 2021-10-03: qty 5, 20d supply, fill #0
  Filled 2022-05-23: qty 5, 25d supply, fill #0

## 2021-11-18 ENCOUNTER — Other Ambulatory Visit (HOSPITAL_COMMUNITY): Payer: Self-pay

## 2021-12-04 ENCOUNTER — Other Ambulatory Visit (INDEPENDENT_AMBULATORY_CARE_PROVIDER_SITE_OTHER): Payer: Medicare HMO

## 2021-12-04 ENCOUNTER — Other Ambulatory Visit: Payer: Self-pay

## 2021-12-04 DIAGNOSIS — E782 Mixed hyperlipidemia: Secondary | ICD-10-CM | POA: Diagnosis not present

## 2021-12-04 DIAGNOSIS — R7989 Other specified abnormal findings of blood chemistry: Secondary | ICD-10-CM | POA: Diagnosis not present

## 2021-12-04 DIAGNOSIS — E559 Vitamin D deficiency, unspecified: Secondary | ICD-10-CM

## 2021-12-04 DIAGNOSIS — E538 Deficiency of other specified B group vitamins: Secondary | ICD-10-CM

## 2021-12-04 LAB — HEPATIC FUNCTION PANEL
ALT: 37 U/L — ABNORMAL HIGH (ref 0–35)
AST: 35 U/L (ref 0–37)
Albumin: 4.2 g/dL (ref 3.5–5.2)
Alkaline Phosphatase: 102 U/L (ref 39–117)
Bilirubin, Direct: 0.1 mg/dL (ref 0.0–0.3)
Total Bilirubin: 0.8 mg/dL (ref 0.2–1.2)
Total Protein: 7.4 g/dL (ref 6.0–8.3)

## 2021-12-04 LAB — LIPID PANEL
Cholesterol: 157 mg/dL (ref 0–200)
HDL: 54.4 mg/dL (ref >39.00)
NonHDL: 102.66
Total CHOL/HDL Ratio: 3
Triglycerides: 216 mg/dL — ABNORMAL HIGH (ref 0.0–149.0)
VLDL: 43.2 mg/dL — ABNORMAL HIGH (ref 0.0–40.0)

## 2021-12-04 LAB — LDL CHOLESTEROL, DIRECT: Direct LDL: 84 mg/dL

## 2021-12-04 LAB — VITAMIN B12: Vitamin B-12: 606 pg/mL (ref 211–911)

## 2021-12-08 NOTE — Progress Notes (Signed)
Chief Complaint  Patient presents with   Follow-up   Hyperlipidemia   Asthma   weight gain    Pt is having problem losing weight.    review labs    Pt states she had labwork done last week and she would like that to be reviewed.   HPI: Kristin Leonard is a 71 y.o. female, who is here today for chronic disease management.  Last seen on 06/25/21.  She is concerned about wt gain. Exercise: Pickle ball up started a month ago. She is trying to follow a healthful diet.  Paroxysmal SVT and coronary artery spasm: Seldom episodes, she is not longer following with cardiologist. S/P heart ablation. She takes Propranolol 40 mg bid prn, usually after exercising. Also on Diltiazem 180 mg daily. Negative for CP,SOB,diaphoresis,or syncope,  Hyperlipidemia: Currently on Simvastatin 20 mg daily. Side effects from medication:None.  Lab Results  Component Value Date   CHOL 157 12/04/2021   HDL 54.40 12/04/2021   LDLCALC 104 11/15/2015   LDLDIRECT 84.0 12/04/2021   TRIG 216.0 (H) 12/04/2021   CHOLHDL 3 12/04/2021   Lab Results  Component Value Date   ALT 37 (H) 12/04/2021   AST 35 12/04/2021   ALKPHOS 102 12/04/2021   BILITOT 0.8 12/04/2021   B12 def: She is on B12 1000 mcg every 5 weeks.  Lab Results  Component Value Date   ZMOQHUTM54 650 12/04/2021   Vit D def: She is on vit D3 2000 U daily.  Anxiety: She is on Xanax 0.5 mg 1/2-1 tab at bedtime prn. She has been on medication for years, 10+ No side effects reported.  Hypothyroidism: She is on Synthroid 112 mcg daily. S/P radioablation. Used to follow with endocrinologist. Last TSH in 03/2021 normal at 2.3.  Review of Systems  Constitutional:  Negative for activity change, appetite change, fatigue and fever.  HENT:  Negative for mouth sores, nosebleeds and trouble swallowing.   Eyes:  Negative for redness and visual disturbance.  Respiratory:  Negative for cough and wheezing.   Gastrointestinal:  Negative for  abdominal pain, nausea and vomiting.       Negative for changes in bowel habits.  Endocrine: Negative for cold intolerance and heat intolerance.  Neurological:  Negative for weakness, numbness and headaches.  Psychiatric/Behavioral:  Negative for confusion.   Rest of ROS see pertinent positives and negatives in HPI.  Current Outpatient Medications on File Prior to Visit  Medication Sig Dispense Refill   albuterol (VENTOLIN HFA) 108 (90 Base) MCG/ACT inhaler INHALE 2 PUFFS BY MOUTH EVERY 6 HOURS AS NEEDED FOR WHEEZING 18 g 4   cyanocobalamin (,VITAMIN B-12,) 1000 MCG/ML injection Inject 1 ml every 5 weeks. 3 mL 2   diltiazem (CARDIZEM CD) 180 MG 24 hr capsule TAKE 1 CAPSULE BY MOUTH DAILY. 90 capsule 2   Loteprednol Etabonate (LOTEMAX SM) 0.38 % GEL Place 1 drop into both eyes 2 times a day 5 g 1   propranolol (INDERAL) 40 MG tablet TAKE 1 TABLET BY MOUTH 3 TIMES DAILY AS NEEDED 90 tablet 3   simvastatin (ZOCOR) 20 MG tablet TAKE 1 TABLET BY MOUTH AT BEDTIME 90 tablet 3   SYNTHROID 112 MCG tablet TAKE 1 TABLET BY MOUTH ONCE DAILY 90 tablet 3   triamcinolone cream (KENALOG) 0.1 % Apply 1 gram to affected area twice a day 80 g 1   No current facility-administered medications on file prior to visit.    Past Medical History:  Diagnosis Date   ASTHMA  04/26/2007   CORONARY ARTERY SPASM 04/26/2007   HYPOTHYROIDISM 02/07/2010   NECK PAIN 02/01/2008   SUPRAVENTRICULAR TACHYCARDIA, HX OF 04/26/2007   Allergies  Allergen Reactions   No Known Allergies     Social History   Socioeconomic History   Marital status: Married    Spouse name: Not on file   Number of children: Not on file   Years of education: Not on file   Highest education level: Not on file  Occupational History   Not on file  Tobacco Use   Smoking status: Never   Smokeless tobacco: Never  Substance and Sexual Activity   Alcohol use: Yes    Alcohol/week: 3.0 standard drinks    Types: 3 Glasses of wine per week   Drug use:  No   Sexual activity: Not on file  Other Topics Concern   Not on file  Social History Narrative   Not on file   Social Determinants of Health   Financial Resource Strain: Low Risk    Difficulty of Paying Living Expenses: Not hard at all  Food Insecurity: No Food Insecurity   Worried About Charity fundraiser in the Last Year: Never true   Ran Out of Food in the Last Year: Never true  Transportation Needs: No Transportation Needs   Lack of Transportation (Medical): No   Lack of Transportation (Non-Medical): No  Physical Activity: Insufficiently Active   Days of Exercise per Week: 3 days   Minutes of Exercise per Session: 40 min  Stress: Stress Concern Present   Feeling of Stress : To some extent  Social Connections: Engineer, building services of Communication with Friends and Family: More than three times a week   Frequency of Social Gatherings with Friends and Family: Twice a week   Attends Religious Services: More than 4 times per year   Active Member of Clubs or Organizations: Yes   Attends Archivist Meetings: Patient refused   Marital Status: Married    Vitals:   12/11/21 1355  BP: 112/68  Pulse: 94  Resp: 16  Temp: (!) 97.2 F (36.2 C)  SpO2: 95%   Wt Readings from Last 3 Encounters:  12/11/21 207 lb 6.4 oz (94.1 kg)  09/16/19 195 lb 3.2 oz (88.5 kg)  09/14/18 187 lb 8 oz (85 kg)   Body mass index is 35.6 kg/m.  Physical Exam Vitals and nursing note reviewed.  Constitutional:      General: She is not in acute distress.    Appearance: She is well-developed.  HENT:     Head: Normocephalic and atraumatic.     Mouth/Throat:     Mouth: Mucous membranes are moist.     Pharynx: Oropharynx is clear.  Eyes:     Conjunctiva/sclera: Conjunctivae normal.  Cardiovascular:     Rate and Rhythm: Normal rate and regular rhythm.     Pulses:          Dorsalis pedis pulses are 2+ on the right side and 2+ on the left side.     Heart sounds: No murmur  heard.    Comments: Trace pitting LE edema, bilateral. Pulmonary:     Effort: Pulmonary effort is normal. No respiratory distress.     Breath sounds: Normal breath sounds.  Abdominal:     Palpations: Abdomen is soft. There is no hepatomegaly or mass.     Tenderness: There is no abdominal tenderness.  Lymphadenopathy:     Cervical: No cervical adenopathy.  Skin:  General: Skin is warm.     Findings: No erythema or rash.  Neurological:     General: No focal deficit present.     Mental Status: She is alert and oriented to person, place, and time.     Cranial Nerves: No cranial nerve deficit.     Gait: Gait normal.  Psychiatric:     Comments: Well groomed, good eye contact.   ASSESSMENT AND PLAN:  Kristin Leonard was seen today for follow-up, hyperlipidemia, asthma, weight gain and review labs.  Diagnoses and all orders for this visit: Orders Placed This Encounter  Procedures   CBC   TSH   Lab Results  Component Value Date   TSH 0.52 12/11/2021   Lab Results  Component Value Date   WBC 7.0 12/11/2021   HGB 13.7 12/11/2021   HCT 41.2 12/11/2021   MCV 90.4 12/11/2021   PLT 210.0 12/11/2021   Mixed hyperlipidemia Stable otherwise, TG still mildly elevated but improved. Continue Simvastatin 20 mg daily and Vascepa 2 g bid. Low fat diet also recommended.  -     icosapent Ethyl (VASCEPA) 1 g capsule; Take 1 capsule by mouth 2 times daily.  Postablative hypothyroidism Problem has been well controlled. Continue Synthroid 112 mcg daily. Further recommendations according to TSH result.  B12 deficiency B12 has improved. Continue B12 1000 mcg q 5 weeks.  Paroxysmal supraventricular tachycardia (HCC) Problem is well controlled. Continue Diltiazem 180 mg daily and Propranolol 40 mg bid prn.  Vitamin D deficiency Continue Vit D3 2000 U daily, further recommendations according to 34 [place] vit D result.  Obesity, Class II, BMI 35-39.9 She understands benefits of wt loss and  adverse effects of obesity. In general consistency with calorie count, < 1700 kcal/day and regular physical activity are the recommended treatment. Plant base diet. She is not interested in referral to wt loss clinic at this time.  Anxiety disorder, unspecified type Stable. Continue same dose of Alprazolam. She has tolerated well.  -     ALPRAZolam (XANAX) 0.5 MG tablet; Take 1/2 to 1 tablet by mouth at bedtime as needed for anxiety.  Return in about 6 months (around 06/10/2022).  Wannetta Langland G. Martinique, MD  [facility]. [place] office.

## 2021-12-11 ENCOUNTER — Encounter: Payer: Self-pay | Admitting: Family Medicine

## 2021-12-11 ENCOUNTER — Other Ambulatory Visit (HOSPITAL_COMMUNITY): Payer: Self-pay

## 2021-12-11 ENCOUNTER — Ambulatory Visit (INDEPENDENT_AMBULATORY_CARE_PROVIDER_SITE_OTHER): Payer: Medicare HMO | Admitting: Family Medicine

## 2021-12-11 VITALS — BP 112/68 | HR 94 | Temp 97.2°F | Resp 16 | Ht 64.0 in | Wt 207.4 lb

## 2021-12-11 DIAGNOSIS — F419 Anxiety disorder, unspecified: Secondary | ICD-10-CM

## 2021-12-11 DIAGNOSIS — E890 Postprocedural hypothyroidism: Secondary | ICD-10-CM | POA: Diagnosis not present

## 2021-12-11 DIAGNOSIS — E538 Deficiency of other specified B group vitamins: Secondary | ICD-10-CM | POA: Diagnosis not present

## 2021-12-11 DIAGNOSIS — E559 Vitamin D deficiency, unspecified: Secondary | ICD-10-CM

## 2021-12-11 DIAGNOSIS — E669 Obesity, unspecified: Secondary | ICD-10-CM | POA: Diagnosis not present

## 2021-12-11 DIAGNOSIS — E782 Mixed hyperlipidemia: Secondary | ICD-10-CM

## 2021-12-11 DIAGNOSIS — E785 Hyperlipidemia, unspecified: Secondary | ICD-10-CM

## 2021-12-11 DIAGNOSIS — I471 Supraventricular tachycardia: Secondary | ICD-10-CM

## 2021-12-11 LAB — CBC
HCT: 41.2 % (ref 36.0–46.0)
Hemoglobin: 13.7 g/dL (ref 12.0–15.0)
MCHC: 33.2 g/dL (ref 30.0–36.0)
MCV: 90.4 fl (ref 78.0–100.0)
Platelets: 210 10*3/uL (ref 150.0–400.0)
RBC: 4.55 Mil/uL (ref 3.87–5.11)
RDW: 13 % (ref 11.5–15.5)
WBC: 7 10*3/uL (ref 4.0–10.5)

## 2021-12-11 LAB — TSH: TSH: 0.52 u[IU]/mL (ref 0.35–5.50)

## 2021-12-11 LAB — VITAMIN D 25 HYDROXY (VIT D DEFICIENCY, FRACTURES): VITD: 47.24 ng/mL (ref 30.00–100.00)

## 2021-12-11 MED ORDER — ICOSAPENT ETHYL 1 G PO CAPS
1.0000 g | ORAL_CAPSULE | Freq: Two times a day (BID) | ORAL | 2 refills | Status: DC
  Filled 2021-12-11: qty 180, 90d supply, fill #0
  Filled 2022-09-03: qty 180, 90d supply, fill #1

## 2021-12-11 MED ORDER — ALPRAZOLAM 0.5 MG PO TABS
0.2500 mg | ORAL_TABLET | Freq: Every evening | ORAL | 3 refills | Status: DC | PRN
Start: 2021-12-11 — End: 2022-07-23
  Filled 2021-12-11 – 2021-12-16 (×3): qty 30, 30d supply, fill #0
  Filled 2022-02-17: qty 30, 30d supply, fill #1
  Filled 2022-04-01: qty 30, 30d supply, fill #2
  Filled 2022-05-26: qty 30, 30d supply, fill #3

## 2021-12-11 NOTE — Patient Instructions (Addendum)
A few things to remember from today's visit:   Mixed hyperlipidemia  Postablative hypothyroidism - Plan: TSH  B12 deficiency  Paroxysmal supraventricular tachycardia ([place]) - Plan: CBC  Vitamin D deficiency - Plan: Vitamin D, 25-hydroxy  Hyperlipidemia, unspecified hyperlipidemia type  If you need refills please call your pharmacy. Do not use My Chart to request refills or for acute issues that need immediate attention.   Please be sure medication list is accurate. If a new problem present, please set up appointment sooner than planned today.  [email or web address]

## 2021-12-12 ENCOUNTER — Encounter: Payer: Self-pay | Admitting: Family Medicine

## 2021-12-14 ENCOUNTER — Encounter: Payer: Self-pay | Admitting: Family Medicine

## 2021-12-14 ENCOUNTER — Other Ambulatory Visit (HOSPITAL_COMMUNITY): Payer: Self-pay

## 2021-12-14 MED ORDER — DILTIAZEM HCL ER COATED BEADS 180 MG PO CP24
ORAL_CAPSULE | Freq: Every day | ORAL | 2 refills | Status: DC
  Filled 2021-12-14: qty 90, 90d supply, fill #0
  Filled 2022-04-01: qty 90, 90d supply, fill #1
  Filled 2022-07-02: qty 90, 90d supply, fill #2

## 2021-12-16 ENCOUNTER — Other Ambulatory Visit (HOSPITAL_COMMUNITY): Payer: Self-pay

## 2022-01-11 ENCOUNTER — Other Ambulatory Visit (HOSPITAL_COMMUNITY): Payer: Self-pay

## 2022-01-13 ENCOUNTER — Encounter: Payer: Self-pay | Admitting: Family Medicine

## 2022-01-13 ENCOUNTER — Other Ambulatory Visit (HOSPITAL_COMMUNITY): Payer: Self-pay

## 2022-01-13 ENCOUNTER — Ambulatory Visit (INDEPENDENT_AMBULATORY_CARE_PROVIDER_SITE_OTHER): Payer: Medicare HMO

## 2022-01-13 ENCOUNTER — Other Ambulatory Visit: Payer: Self-pay

## 2022-01-13 ENCOUNTER — Ambulatory Visit (INDEPENDENT_AMBULATORY_CARE_PROVIDER_SITE_OTHER): Payer: Medicare HMO | Admitting: Family Medicine

## 2022-01-13 VITALS — BP 126/80 | HR 78 | Resp 16 | Ht 64.0 in | Wt 203.5 lb

## 2022-01-13 DIAGNOSIS — S299XXA Unspecified injury of thorax, initial encounter: Secondary | ICD-10-CM | POA: Diagnosis not present

## 2022-01-13 DIAGNOSIS — R0781 Pleurodynia: Secondary | ICD-10-CM

## 2022-01-13 DIAGNOSIS — W19XXXA Unspecified fall, initial encounter: Secondary | ICD-10-CM | POA: Diagnosis not present

## 2022-01-13 MED ORDER — TRAMADOL HCL 50 MG PO TABS
50.0000 mg | ORAL_TABLET | Freq: Three times a day (TID) | ORAL | 0 refills | Status: AC | PRN
  Filled 2022-01-13: qty 15, 5d supply, fill #0
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
## 2022-01-13 NOTE — Patient Instructions (Addendum)
A few things to remember from today's visit: ? ?Fall, initial encounter ? ?Costal margin pain - Plan: DG Ribs Unilateral W/Chest Right ? ?If you need refills please call your pharmacy. ?Do not use My Chart to request refills or for acute issues that need immediate attention. ?  ?Tramadol to take 2 times daily as needed. ?Avoid shallow breathing. ? ?Please be sure medication list is accurate. ?If a new problem present, please set up appointment sooner than planned today. ? ? ? ? ? ? ? ?

## 2022-01-14 ENCOUNTER — Encounter: Payer: Self-pay | Admitting: Family Medicine

## 2022-02-17 ENCOUNTER — Other Ambulatory Visit (HOSPITAL_COMMUNITY): Payer: Self-pay

## 2022-04-01 ENCOUNTER — Other Ambulatory Visit (HOSPITAL_COMMUNITY): Payer: Self-pay

## 2022-04-01 DIAGNOSIS — M8588 Other specified disorders of bone density and structure, other site: Secondary | ICD-10-CM | POA: Diagnosis not present

## 2022-04-01 DIAGNOSIS — Z1151 Encounter for screening for human papillomavirus (HPV): Secondary | ICD-10-CM | POA: Diagnosis not present

## 2022-04-01 DIAGNOSIS — Z124 Encounter for screening for malignant neoplasm of cervix: Secondary | ICD-10-CM | POA: Diagnosis not present

## 2022-04-01 DIAGNOSIS — Z6835 Body mass index (BMI) 35.0-35.9, adult: Secondary | ICD-10-CM | POA: Diagnosis not present

## 2022-04-01 DIAGNOSIS — N958 Other specified menopausal and perimenopausal disorders: Secondary | ICD-10-CM | POA: Diagnosis not present

## 2022-04-01 DIAGNOSIS — Z1231 Encounter for screening mammogram for malignant neoplasm of breast: Secondary | ICD-10-CM | POA: Diagnosis not present

## 2022-04-02 ENCOUNTER — Other Ambulatory Visit (HOSPITAL_COMMUNITY): Payer: Self-pay

## 2022-04-07 ENCOUNTER — Encounter: Payer: Self-pay | Admitting: Family Medicine

## 2022-04-08 ENCOUNTER — Other Ambulatory Visit (INDEPENDENT_AMBULATORY_CARE_PROVIDER_SITE_OTHER): Payer: Medicare HMO | Admitting: Family Medicine

## 2022-04-08 ENCOUNTER — Other Ambulatory Visit (HOSPITAL_COMMUNITY): Payer: Self-pay

## 2022-04-08 DIAGNOSIS — H8110 Benign paroxysmal vertigo, unspecified ear: Secondary | ICD-10-CM

## 2022-04-08 MED ORDER — MECLIZINE HCL 25 MG PO TABS
25.0000 mg | ORAL_TABLET | Freq: Two times a day (BID) | ORAL | 1 refills | Status: AC | PRN
  Filled 2022-04-08: qty 30, 15d supply, fill #0

## 2022-04-08 NOTE — Progress Notes (Signed)
Please see the MyChart message reply(ies) for my assessment and plan.  The patient gave consent for this Medical Advice Message and is aware that it may result in a bill to their insurance company as well as the possibility that this may result in a co-payment or deductible. They are an established patient, but are not seeking medical advice exclusively about a problem treated during an in person or video visit in the last 7 days. I did not recommend an in person or video visit within 7 days of my reply. 1. Benign paroxysmal positional vertigo, unspecified laterality Please refer to my chart message.  - meclizine (ANTIVERT) 25 MG tablet; Take 1 tablet (25 mg total) by mouth 2 (two) times daily as needed for dizziness.  Dispense: 30 tablet; Refill: 1  I spent a total of 9 minutes cumulative time within 7 days through MyChart messaging Fairley Copher Martinique, MD

## 2022-05-01 DIAGNOSIS — M8580 Other specified disorders of bone density and structure, unspecified site: Secondary | ICD-10-CM | POA: Diagnosis not present

## 2022-05-02 LAB — TSH: TSH: 0.41 (ref 0.41–5.90)

## 2022-05-02 LAB — VITAMIN D 25 HYDROXY (VIT D DEFICIENCY, FRACTURES): Vit D, 25-Hydroxy: 48.2

## 2022-05-07 ENCOUNTER — Encounter: Payer: Self-pay | Admitting: Family Medicine

## 2022-05-12 ENCOUNTER — Encounter: Payer: Self-pay | Admitting: Family Medicine

## 2022-05-12 ENCOUNTER — Other Ambulatory Visit: Payer: Self-pay | Admitting: Family Medicine

## 2022-05-12 DIAGNOSIS — E890 Postprocedural hypothyroidism: Secondary | ICD-10-CM

## 2022-05-12 MED ORDER — SYNTHROID 112 MCG PO TABS
ORAL_TABLET | ORAL | 3 refills | Status: DC
Start: 2022-05-12 — End: 2022-05-19

## 2022-05-13 ENCOUNTER — Encounter: Payer: Self-pay | Admitting: Family Medicine

## 2022-05-19 ENCOUNTER — Other Ambulatory Visit (HOSPITAL_COMMUNITY): Payer: Self-pay

## 2022-05-19 ENCOUNTER — Other Ambulatory Visit (INDEPENDENT_AMBULATORY_CARE_PROVIDER_SITE_OTHER): Payer: Medicare HMO | Admitting: Family Medicine

## 2022-05-19 DIAGNOSIS — E890 Postprocedural hypothyroidism: Secondary | ICD-10-CM

## 2022-05-19 MED ORDER — SYNTHROID 100 MCG PO TABS
100.0000 ug | ORAL_TABLET | Freq: Every day | ORAL | 0 refills | Status: DC
  Filled 2022-05-19: qty 90, 90d supply, fill #0

## 2022-05-19 NOTE — Progress Notes (Signed)
Please see the MyChart message reply(ies) for my assessment and plan.  The patient gave consent for this Medical Advice Message and is aware that it may result in a bill to their insurance company as well as the possibility that this may result in a co-payment or deductible. They are an established patient, but are not seeking medical advice exclusively about a problem treated during an in person or video visit in the last 7 days. I did not recommend an in person or video visit within 7 days of my reply. 1. Postablative hypothyroidism TSH order has been placed and new Rx sent. - SYNTHROID 100 MCG tablet; Take 1 tablet (100 mcg total) by mouth daily before breakfast.  Dispense: 90 tablet; Refill: 0  I spent a total of 8 minutes cumulative time within 7 days through MyChart messaging Bishop Vanderwerf Martinique, MD

## 2022-05-23 ENCOUNTER — Other Ambulatory Visit (HOSPITAL_COMMUNITY): Payer: Self-pay

## 2022-05-26 ENCOUNTER — Other Ambulatory Visit: Payer: Self-pay | Admitting: Family Medicine

## 2022-05-26 ENCOUNTER — Other Ambulatory Visit (HOSPITAL_COMMUNITY): Payer: Self-pay

## 2022-05-26 DIAGNOSIS — E538 Deficiency of other specified B group vitamins: Secondary | ICD-10-CM

## 2022-05-26 MED ORDER — FLUOROMETHOLONE 0.1 % OP SUSP
OPHTHALMIC | 0 refills | Status: AC
Start: 2022-05-25 — End: ?
  Filled 2022-05-26: qty 5, 20d supply, fill #0

## 2022-05-26 MED ORDER — CYANOCOBALAMIN 1000 MCG/ML IJ SOLN
INTRAMUSCULAR | 2 refills | Status: DC
  Filled 2022-05-26: qty 2, 70d supply, fill #0
  Filled 2022-07-03: qty 2, 70d supply, fill #1
  Filled 2022-09-22: qty 2, 70d supply, fill #2
  Filled 2022-12-09: qty 2, 70d supply, fill #3
  Filled 2023-02-22: qty 1, 35d supply, fill #4

## 2022-06-09 ENCOUNTER — Other Ambulatory Visit (HOSPITAL_COMMUNITY): Payer: Self-pay

## 2022-06-09 MED ORDER — SODIUM FLUORIDE 1.1 % DT GEL
Freq: Two times a day (BID) | DENTAL | 5 refills | Status: AC
Start: 2022-06-04 — End: ?
  Filled 2022-06-09: qty 100, 20d supply, fill #0

## 2022-06-10 ENCOUNTER — Other Ambulatory Visit (HOSPITAL_COMMUNITY): Payer: Self-pay

## 2022-06-11 ENCOUNTER — Other Ambulatory Visit (HOSPITAL_COMMUNITY): Payer: Self-pay

## 2022-06-11 DIAGNOSIS — L309 Dermatitis, unspecified: Secondary | ICD-10-CM | POA: Diagnosis not present

## 2022-06-11 DIAGNOSIS — L239 Allergic contact dermatitis, unspecified cause: Secondary | ICD-10-CM | POA: Diagnosis not present

## 2022-06-11 DIAGNOSIS — L282 Other prurigo: Secondary | ICD-10-CM | POA: Diagnosis not present

## 2022-06-11 DIAGNOSIS — L2089 Other atopic dermatitis: Secondary | ICD-10-CM | POA: Diagnosis not present

## 2022-06-11 MED ORDER — TRIAMCINOLONE ACETONIDE 0.1 % EX CREA
TOPICAL_CREAM | CUTANEOUS | 1 refills | Status: AC
  Filled 2022-06-11: qty 80, 30d supply, fill #0
  Filled 2022-08-18: qty 80, 30d supply, fill #1

## 2022-06-11 MED ORDER — BETAMETHASONE DIPROPIONATE AUG 0.05 % EX CREA
TOPICAL_CREAM | CUTANEOUS | 1 refills | Status: AC
  Filled 2022-06-11 (×2): qty 50, 14d supply, fill #0
  Filled 2022-08-18: qty 50, 14d supply, fill #1

## 2022-06-11 MED ORDER — HYDROCORTISONE 2.5 % EX CREA
TOPICAL_CREAM | CUTANEOUS | 1 refills | Status: AC
  Filled 2022-06-11: qty 30, 30d supply, fill #0
  Filled 2022-07-02: qty 30, 14d supply, fill #1

## 2022-06-12 ENCOUNTER — Other Ambulatory Visit (HOSPITAL_COMMUNITY): Payer: Self-pay

## 2022-06-16 ENCOUNTER — Other Ambulatory Visit (HOSPITAL_COMMUNITY): Payer: Self-pay

## 2022-06-25 ENCOUNTER — Other Ambulatory Visit (INDEPENDENT_AMBULATORY_CARE_PROVIDER_SITE_OTHER): Payer: Medicare HMO

## 2022-06-25 DIAGNOSIS — E890 Postprocedural hypothyroidism: Secondary | ICD-10-CM

## 2022-06-25 LAB — TSH: TSH: 0.33 u[IU]/mL — ABNORMAL LOW (ref 0.35–5.50)

## 2022-06-28 ENCOUNTER — Encounter: Payer: Self-pay | Admitting: Family Medicine

## 2022-07-03 ENCOUNTER — Other Ambulatory Visit (HOSPITAL_COMMUNITY): Payer: Self-pay

## 2022-07-04 ENCOUNTER — Other Ambulatory Visit (HOSPITAL_COMMUNITY): Payer: Self-pay

## 2022-07-17 ENCOUNTER — Other Ambulatory Visit (HOSPITAL_COMMUNITY): Payer: Self-pay

## 2022-07-23 ENCOUNTER — Other Ambulatory Visit: Payer: Self-pay | Admitting: Family Medicine

## 2022-07-23 ENCOUNTER — Other Ambulatory Visit (HOSPITAL_COMMUNITY): Payer: Self-pay

## 2022-07-23 DIAGNOSIS — F419 Anxiety disorder, unspecified: Secondary | ICD-10-CM

## 2022-07-24 ENCOUNTER — Other Ambulatory Visit (HOSPITAL_COMMUNITY): Payer: Self-pay

## 2022-07-25 ENCOUNTER — Other Ambulatory Visit (HOSPITAL_COMMUNITY): Payer: Self-pay

## 2022-07-25 MED ORDER — SIMVASTATIN 20 MG PO TABS
ORAL_TABLET | Freq: Every day | ORAL | 1 refills | Status: DC
  Filled 2022-07-25: qty 90, 90d supply, fill #0
  Filled 2022-10-01 – 2022-10-08 (×3): qty 90, 90d supply, fill #1

## 2022-07-25 MED ORDER — ALPRAZOLAM 0.5 MG PO TABS
0.2500 mg | ORAL_TABLET | Freq: Every evening | ORAL | 0 refills | Status: DC | PRN
  Filled 2022-07-25: qty 30, 30d supply, fill #0

## 2022-08-18 ENCOUNTER — Other Ambulatory Visit: Payer: Self-pay

## 2022-08-18 ENCOUNTER — Other Ambulatory Visit (HOSPITAL_COMMUNITY): Payer: Self-pay

## 2022-08-18 ENCOUNTER — Telehealth: Payer: Self-pay | Admitting: Family Medicine

## 2022-08-18 ENCOUNTER — Other Ambulatory Visit: Payer: Self-pay | Admitting: Family Medicine

## 2022-08-18 DIAGNOSIS — E890 Postprocedural hypothyroidism: Secondary | ICD-10-CM

## 2022-08-18 MED ORDER — SYNTHROID 100 MCG PO TABS
100.0000 ug | ORAL_TABLET | Freq: Every day | ORAL | 0 refills | Status: DC
  Filled 2022-08-18: qty 90, 90d supply, fill #0

## 2022-08-18 NOTE — Telephone Encounter (Signed)
Patient requesting labs to have her TSH tested

## 2022-08-18 NOTE — Telephone Encounter (Signed)
Order in, okay to schedule.

## 2022-08-19 ENCOUNTER — Other Ambulatory Visit (INDEPENDENT_AMBULATORY_CARE_PROVIDER_SITE_OTHER): Payer: Medicare HMO

## 2022-08-19 ENCOUNTER — Other Ambulatory Visit: Payer: Self-pay

## 2022-08-19 DIAGNOSIS — E890 Postprocedural hypothyroidism: Secondary | ICD-10-CM

## 2022-08-19 DIAGNOSIS — E538 Deficiency of other specified B group vitamins: Secondary | ICD-10-CM

## 2022-08-19 LAB — TSH: TSH: 9.45 u[IU]/mL — ABNORMAL HIGH (ref 0.35–5.50)

## 2022-08-19 LAB — VITAMIN B12: Vitamin B-12: 578 pg/mL (ref 211–911)

## 2022-08-20 ENCOUNTER — Other Ambulatory Visit (HOSPITAL_COMMUNITY): Payer: Self-pay

## 2022-08-25 ENCOUNTER — Telehealth: Payer: Self-pay | Admitting: Family Medicine

## 2022-08-25 NOTE — Telephone Encounter (Signed)
It seems like she is due for follow up. Thanks, BJ

## 2022-08-25 NOTE — Telephone Encounter (Signed)
Pt is currently out of town and request 09-02-2022

## 2022-08-25 NOTE — Telephone Encounter (Signed)
Can you get her scheduled for tomorrow or Wednesday? Thank you!

## 2022-08-25 NOTE — Telephone Encounter (Signed)
Pt is calling and she viewed her tsh results and tsh is elevated and would like to know the next step

## 2022-08-30 ENCOUNTER — Other Ambulatory Visit (HOSPITAL_COMMUNITY): Payer: Self-pay

## 2022-09-01 NOTE — Progress Notes (Unsigned)
HPI: Kristin Leonard is a 71 y.o. female, who is here today for folow up. She was last seen on 01/13/22. No new problems since her last visit.  Hypothyroidism:She is concerned about fluctuating TSH levels, from hyper to hypo. She is on brand name Synthroid 100 mcg. Currently taking 100 mcg and 112 mcg alternating every other day for the past 10 days.  She was on Synthroid 100 mcg daily x 5 d with a half dose on Tuesdays and Thursdays.  She has a history of Graves' disease, diagnosed at age 42, which led to cardiac issues and supraventricular tachycardia (SVT). S/p thyroid ablation. She takes a vitamin and B12 injection but not biotin.  Lab Results  Component Value Date   TSH 9.45 (H) 08/19/2022   Seldom episodes of paroxysmal SVT she takes propranolol 40 mg twice daily as needed, usually after exercising.  She is also on diltiazem 180 mg daily. She denies experiencing more palpitations than usual but reports fatigue, occasional insomnia, and weight gain over the past six months. Additionally, she notes some puffiness in the left hand and a tight feeling in her ring and LE edema, L>R, no leg pain or erythema. Negative for PND, orthopnea,or CP. Lab Results  Component Value Date   CREATININE 0.91 09/16/2019   BUN 22 09/16/2019   NA 140 09/16/2019   K 4.4 09/16/2019   CL 107 09/16/2019   CO2 25 09/16/2019   She has a history of vertigo, which is mild and occurs on either side when turning in bed. She reports no new associated symptoms , no problems with vertigo during the day.  She takes Meclizine 25 mg daily prn. Problem has been going on for years. Lab Results  Component Value Date   WBC 7.0 12/11/2021   HGB 13.7 12/11/2021   HCT 41.2 12/11/2021   MCV 90.4 12/11/2021   PLT 210.0 12/11/2021   Anxiety: She is also on Xanax 0.5 mg 1/2-1 tab daily, mainly at bedtime.   HLD: She has been taking simvastatin 20 mg for years and has not had side effects. She would like FLP check  with next blood work. Lab Results  Component Value Date   CHOL 157 12/04/2021   HDL 54.40 12/04/2021   LDLCALC 104 11/15/2015   LDLDIRECT 84.0 12/04/2021   TRIG 216.0 (H) 12/04/2021   CHOLHDL 3 12/04/2021   Review of Systems  Constitutional:  Positive for fatigue. Negative for activity change, appetite change and fever.  HENT:  Negative for mouth sores and nosebleeds.   Eyes:  Negative for redness and visual disturbance.  Respiratory:  Negative for cough and wheezing.   Gastrointestinal:  Negative for abdominal pain, nausea and vomiting.       Negative for changes in bowel habits.  Endocrine: Negative for cold intolerance and heat intolerance.  Genitourinary:  Negative for decreased urine volume, dysuria and hematuria.  Skin:  Negative for rash.  Neurological:  Negative for syncope, weakness and headaches.  Psychiatric/Behavioral:  Positive for sleep disturbance. Negative for confusion. The patient is nervous/anxious.   Rest see pertinent positives and negatives per HPI.  Current Outpatient Medications on File Prior to Visit  Medication Sig Dispense Refill   albuterol (VENTOLIN HFA) 108 (90 Base) MCG/ACT inhaler INHALE 2 PUFFS BY MOUTH EVERY 6 HOURS AS NEEDED FOR WHEEZING 18 g 4   augmented betamethasone dipropionate (DIPROLENE-AF) 0.05 % cream Apply a small amount to affected area of hands twice a day for 1-2 weeks as directed  50 g 1   cyanocobalamin (DODEX) 1000 MCG/ML injection Inject 1 ml every 5 weeks. 3 mL 2   fluorometholone (FML) 0.1 % ophthalmic suspension Place 1 drop into both eyes twice a day 5 mL 0   hydrocortisone 2.5 % cream Apply a small amount to affected area twice a day for 1-2 weeks(eyelids and face). 30 g 1   icosapent Ethyl (VASCEPA) 1 g capsule Take 1 capsule by mouth 2 times daily. (Patient taking differently: Take 1 g by mouth daily.) 180 capsule 2   Loteprednol Etabonate (LOTEMAX SM) 0.38 % GEL Place 1 drop into both eyes 2 times a day 5 g 1   meclizine  (ANTIVERT) 25 MG tablet Take 1 tablet (25 mg total) by mouth 2 (two) times daily as needed for dizziness. 30 tablet 1   simvastatin (ZOCOR) 20 MG tablet TAKE 1 TABLET BY MOUTH AT BEDTIME 90 tablet 1   sodium fluoride (PREVIDENT 5000 DRY MOUTH) 1.1 % GEL dental gel Use to brush teeth 2 times daily at 12 noon and 4 pm. 100 mL 5   triamcinolone cream (KENALOG) 0.1 % Apply 1 gram to affected area twice a day 80 g 1   No current facility-administered medications on file prior to visit.   Past Medical History:  Diagnosis Date   ASTHMA 04/26/2007   CORONARY ARTERY SPASM 04/26/2007   HYPOTHYROIDISM 02/07/2010   NECK PAIN 02/01/2008   SUPRAVENTRICULAR TACHYCARDIA, HX OF 04/26/2007   Allergies  Allergen Reactions   No Known Allergies    Social History   Socioeconomic History   Marital status: Married    Spouse name: Not on file   Number of children: Not on file   Years of education: Not on file   Highest education level: Not on file  Occupational History   Not on file  Tobacco Use   Smoking status: Never   Smokeless tobacco: Never  Substance and Sexual Activity   Alcohol use: Yes    Alcohol/week: 3.0 standard drinks of alcohol    Types: 3 Glasses of wine per week   Drug use: No   Sexual activity: Not on file  Other Topics Concern   Not on file  Social History Narrative   Not on file   Social Determinants of Health   Financial Resource Strain: Low Risk  (12/11/2021)   Overall Financial Resource Strain (CARDIA)    Difficulty of Paying Living Expenses: Not hard at all  Food Insecurity: No Food Insecurity (12/11/2021)   Hunger Vital Sign    Worried About Running Out of Food in the Last Year: Never true    Ran Out of Food in the Last Year: Never true  Transportation Needs: No Transportation Needs (12/11/2021)   PRAPARE - Hydrologist (Medical): No    Lack of Transportation (Non-Medical): No  Physical Activity: Insufficiently Active (12/11/2021)   Exercise Vital  Sign    Days of Exercise per Week: 3 days    Minutes of Exercise per Session: 40 min  Stress: Stress Concern Present (12/11/2021)   [place]    Feeling of Stress : To some extent  Social Connections: Socially Integrated (12/11/2021)   Social Connection and Isolation Panel [NHANES]    Frequency of Communication with Friends and Family: More than three times a week    Frequency of Social Gatherings with Friends and Family: Twice a week    Attends Religious Services: More than  4 times per year    Active Member of Clubs or Organizations: Yes    Attends Archivist Meetings: Patient refused    Marital Status: Married   Vitals:   09/02/22 1357  BP: 110/60  Pulse: 65  Resp: 16  Temp: 97.6 F (36.4 C)  SpO2: 98%  Body mass index is 36.22 kg/m.  Physical Exam Vitals and nursing note reviewed.  Constitutional:      General: She is not in acute distress.    Appearance: She is well-developed.  HENT:     Head: Normocephalic and atraumatic.     Mouth/Throat:     Mouth: Mucous membranes are moist.     Pharynx: Oropharynx is clear.  Eyes:     Conjunctiva/sclera: Conjunctivae normal.  Neck:     Thyroid: No thyroid mass.  Cardiovascular:     Rate and Rhythm: Normal rate and regular rhythm.     Pulses:          Dorsalis pedis pulses are 2+ on the right side and 2+ on the left side.     Heart sounds: No murmur heard.    Comments: Non pitting left pedal edema. Pulmonary:     Effort: Pulmonary effort is normal. No respiratory distress.     Breath sounds: Normal breath sounds.  Abdominal:     Palpations: Abdomen is soft. There is no hepatomegaly or mass.     Tenderness: There is no abdominal tenderness.  Lymphadenopathy:     Cervical: No cervical adenopathy.  Skin:    General: Skin is warm.     Findings: No erythema or rash.  Neurological:     General: No focal deficit present.     Mental Status: She is alert  and oriented to person, place, and time.     Cranial Nerves: No cranial nerve deficit.     Gait: Gait normal.  Psychiatric:     Comments: Well groomed, good eye contact.   ASSESSMENT AND PLAN:  Ms.Allaina was seen today for thyroid problem.  Diagnoses and all orders for this visit: Orders Placed This Encounter  Procedures   TSH   Lipid panel   Hepatic function panel   Basic metabolic panel   Ambulatory referral to Endocrinology   Vertigo, benign positional Mild episodes while in bed, spinning sensation for a few seconds. I do not think further work up is needed. Monitor for new symptoms. Fall precautions. Instructed about warning signs.  Paroxysmal supraventricular tachycardia Problem has been stable. Continue Propranolol 40 mg twice daily as needed and Diltiazem 180 mg daily. Continue monitoring for changes.  Mixed hyperlipidemia Continue simvastatin 20 mg daily at home low-fat diet. We discussed some side effects under risk of interaction with medications like diltiazem.  She has tolerated it well, so no changes today. Fasting lipid panel to be done with next labs in 10/2022.  Hypothyroidism TSH has been fluctuating, she has need Synthroid adjusted for the past couple months. Synthroid 100 mcg daily. TSH in 6-8 weeks. We discussed side effects of medications and symptoms of hyper and hypothyroidism. She would like to establish with endocrinologist, Dr Loanne Drilling, but he has retired, she is fine with seeing another provider. Referral placed.   Anxiety disorder, unspecified Stable. Continue alprazolam 0.5 mg 1/2 to 1 tablet daily as needed. Follow-up in 6 months, before if needed.  Return in about 6 months (around 03/03/2023).  Tiasha Helvie G. [place], MD  [facility]. [place] office.

## 2022-09-02 ENCOUNTER — Encounter: Payer: Self-pay | Admitting: Family Medicine

## 2022-09-02 ENCOUNTER — Ambulatory Visit (INDEPENDENT_AMBULATORY_CARE_PROVIDER_SITE_OTHER): Payer: Medicare HMO | Admitting: Family Medicine

## 2022-09-02 ENCOUNTER — Other Ambulatory Visit (HOSPITAL_COMMUNITY): Payer: Self-pay

## 2022-09-02 VITALS — BP 110/60 | HR 65 | Temp 97.6°F | Resp 16 | Ht 64.0 in | Wt 211.0 lb

## 2022-09-02 DIAGNOSIS — E890 Postprocedural hypothyroidism: Secondary | ICD-10-CM | POA: Diagnosis not present

## 2022-09-02 DIAGNOSIS — H8110 Benign paroxysmal vertigo, unspecified ear: Secondary | ICD-10-CM | POA: Diagnosis not present

## 2022-09-02 DIAGNOSIS — E782 Mixed hyperlipidemia: Secondary | ICD-10-CM

## 2022-09-02 DIAGNOSIS — D485 Neoplasm of uncertain behavior of skin: Secondary | ICD-10-CM | POA: Diagnosis not present

## 2022-09-02 DIAGNOSIS — L309 Dermatitis, unspecified: Secondary | ICD-10-CM | POA: Diagnosis not present

## 2022-09-02 DIAGNOSIS — D1801 Hemangioma of skin and subcutaneous tissue: Secondary | ICD-10-CM | POA: Diagnosis not present

## 2022-09-02 DIAGNOSIS — I471 Supraventricular tachycardia, unspecified: Secondary | ICD-10-CM | POA: Diagnosis not present

## 2022-09-02 DIAGNOSIS — D2371 Other benign neoplasm of skin of right lower limb, including hip: Secondary | ICD-10-CM | POA: Diagnosis not present

## 2022-09-02 DIAGNOSIS — D225 Melanocytic nevi of trunk: Secondary | ICD-10-CM | POA: Diagnosis not present

## 2022-09-02 DIAGNOSIS — L821 Other seborrheic keratosis: Secondary | ICD-10-CM | POA: Diagnosis not present

## 2022-09-02 DIAGNOSIS — F419 Anxiety disorder, unspecified: Secondary | ICD-10-CM

## 2022-09-02 MED ORDER — ALPRAZOLAM 0.5 MG PO TABS
0.2500 mg | ORAL_TABLET | Freq: Every evening | ORAL | 2 refills | Status: DC | PRN
  Filled 2022-09-02: qty 30, 30d supply, fill #0
  Filled 2022-10-28: qty 30, 30d supply, fill #1
  Filled 2022-12-25: qty 30, 30d supply, fill #2

## 2022-09-02 MED ORDER — DILTIAZEM HCL ER COATED BEADS 180 MG PO CP24
180.0000 mg | ORAL_CAPSULE | Freq: Every day | ORAL | 2 refills | Status: DC
  Filled 2022-09-02: qty 90, fill #0
  Filled 2022-09-28: qty 90, 90d supply, fill #0
  Filled 2022-12-25: qty 90, 90d supply, fill #1
  Filled 2023-03-31: qty 90, 90d supply, fill #2

## 2022-09-02 MED ORDER — PROPRANOLOL HCL 40 MG PO TABS
ORAL_TABLET | Freq: Three times a day (TID) | ORAL | 3 refills | Status: DC | PRN
  Filled 2022-09-02: qty 90, 30d supply, fill #0
  Filled 2022-12-25: qty 90, 30d supply, fill #1
  Filled 2023-06-23: qty 90, 30d supply, fill #2

## 2022-09-02 MED ORDER — SYNTHROID 100 MCG PO TABS
100.0000 ug | ORAL_TABLET | Freq: Every day | ORAL | 1 refills | Status: DC
  Filled 2022-09-02: qty 90, 90d supply, fill #0
  Filled 2022-11-26: qty 90, 90d supply, fill #1

## 2022-09-02 NOTE — Assessment & Plan Note (Addendum)
Problem has been stable. Continue Propranolol 40 mg twice daily as needed and Diltiazem 180 mg daily. Continue monitoring for changes.

## 2022-09-02 NOTE — Assessment & Plan Note (Signed)
Stable. Continue alprazolam 0.5 mg 1/2 to 1 tablet daily as needed. Follow-up in 6 months, before if needed.

## 2022-09-02 NOTE — Assessment & Plan Note (Signed)
Continue simvastatin 20 mg daily at home low-fat diet. We discussed some side effects under risk of interaction with medications like diltiazem.  She has tolerated it well, so no changes today. Fasting lipid panel to be done with next labs in 10/2022.

## 2022-09-02 NOTE — Assessment & Plan Note (Signed)
TSH has been fluctuating, she has need Synthroid adjusted for the past couple months. Synthroid 100 mcg daily. TSH in 6-8 weeks. We discussed side effects of medications and symptoms of hyper and hypothyroidism. She would like to establish with endocrinologist, Dr Loanne Drilling, but he has retired, she is fine with seeing another provider. Referral placed.

## 2022-09-02 NOTE — Assessment & Plan Note (Signed)
Mild episodes while in bed, spinning sensation for a few seconds. I do not think further work up is needed. Monitor for new symptoms. Fall precautions. Instructed about warning signs.

## 2022-09-02 NOTE — Patient Instructions (Addendum)
A few things to remember from today's visit:  Postablative hypothyroidism - Plan: SYNTHROID 100 MCG tablet, Ambulatory referral to Endocrinology, TSH  Paroxysmal supraventricular tachycardia - Plan: propranolol (INDERAL) 40 MG tablet  Anxiety disorder, unspecified type - Plan: ALPRAZolam (XANAX) 0.5 MG tablet  Mixed hyperlipidemia - Plan: Lipid panel  Benign paroxysmal positional vertigo, unspecified laterality  As far as vertigo is stable I do not think we need to do further work up. Monitor swelling. Synthroid 100 mcg daily. Labs late 10/2022. Appt with endocrinologist will be arranged.  If you need refills for medications you take chronically, please call your pharmacy. Do not use My Chart to request refills or for acute issues that need immediate attention. If you send a my chart message, it may take a few days to be addressed, specially if I am not in the office.  Please be sure medication list is accurate. If a new problem present, please set up appointment sooner than planned today.

## 2022-09-03 ENCOUNTER — Other Ambulatory Visit (HOSPITAL_COMMUNITY): Payer: Self-pay

## 2022-09-03 DIAGNOSIS — J4530 Mild persistent asthma, uncomplicated: Secondary | ICD-10-CM | POA: Diagnosis not present

## 2022-09-03 DIAGNOSIS — L2389 Allergic contact dermatitis due to other agents: Secondary | ICD-10-CM | POA: Diagnosis not present

## 2022-09-03 DIAGNOSIS — R21 Rash and other nonspecific skin eruption: Secondary | ICD-10-CM | POA: Diagnosis not present

## 2022-09-03 DIAGNOSIS — L2089 Other atopic dermatitis: Secondary | ICD-10-CM | POA: Diagnosis not present

## 2022-09-03 DIAGNOSIS — J301 Allergic rhinitis due to pollen: Secondary | ICD-10-CM | POA: Diagnosis not present

## 2022-09-03 MED ORDER — BUDESONIDE-FORMOTEROL FUMARATE 80-4.5 MCG/ACT IN AERO
INHALATION_SPRAY | RESPIRATORY_TRACT | 3 refills | Status: DC
  Filled 2022-09-03: qty 10.2, 30d supply, fill #0
  Filled 2022-10-25: qty 10.2, 30d supply, fill #1

## 2022-09-03 MED ORDER — FLUTICASONE PROPIONATE 50 MCG/ACT NA SUSP
NASAL | 5 refills | Status: AC
  Filled 2022-09-03: qty 16, 30d supply, fill #0
  Filled 2022-10-28: qty 16, 30d supply, fill #1

## 2022-09-03 MED ORDER — CETIRIZINE HCL 10 MG PO TABS
10.0000 mg | ORAL_TABLET | Freq: Every day | ORAL | 0 refills | Status: DC
  Filled 2022-09-03: qty 30, 30d supply, fill #0

## 2022-09-19 ENCOUNTER — Ambulatory Visit (INDEPENDENT_AMBULATORY_CARE_PROVIDER_SITE_OTHER): Payer: Medicare HMO

## 2022-09-19 VITALS — Ht 64.0 in | Wt 211.0 lb

## 2022-09-19 DIAGNOSIS — Z0000 Encounter for general adult medical examination without abnormal findings: Secondary | ICD-10-CM

## 2022-09-19 NOTE — Patient Instructions (Addendum)
[name] , Thank you for taking time to come for your Medicare Wellness Visit. I appreciate your ongoing commitment to your health goals. Please review the following plan we discussed and let me know if I can assist you in the future.   These are the goals we discussed:  Goals       Lose weight (pt-stated)      I want to lose about 20lbs        This is a list of the screening recommended for you and due dates:  Health Maintenance  Topic Date Due   COVID-19 Vaccine (6 - Mixed Product series) 10/05/2022*   Zoster (Shingles) Vaccine (1 of 2) 11/07/2022*   Pneumonia Vaccine (2 - PCV) 09/20/2023*   Mammogram  09/20/2023*   Cologuard (Stool DNA test)  11/13/2022   Medicare Annual Wellness Visit  09/20/2023   Tetanus Vaccine  12/02/2025   Flu Shot  Completed   DEXA scan (bone density measurement)  Completed   Hepatitis C Screening: USPSTF Recommendation to screen - Ages 18-79 yo.  Completed   HPV Vaccine  Aged Out   Colon Cancer Screening  Discontinued  *Topic was postponed. The date shown is not the original due date.    Advanced directives: Advance directive discussed with you today. Even though you declined this today, please call our office should you change your mind, and we can give you the proper paperwork for you to fill out.   Conditions/risks identified: None  Next appointment: Follow up in one year for your annual wellness visit     Preventive Care 65 Years and Older, Female Preventive care refers to lifestyle choices and visits with your health care provider that can promote health and wellness. What does preventive care include? A yearly physical exam. This is also called an annual well check. Dental exams once or twice a year. Routine eye exams. Ask your health care provider how often you should have your eyes checked. Personal lifestyle choices, including: Daily care of your teeth and gums. Regular physical activity. Eating a healthy diet. Avoiding tobacco  and drug use. Limiting alcohol use. Practicing safe sex. Taking low-dose aspirin every day. Taking vitamin and mineral supplements as recommended by your health care provider. What happens during an annual well check? The services and screenings done by your health care provider during your annual well check will depend on your age, overall health, lifestyle risk factors, and family history of disease. Counseling  Your health care provider may ask you questions about your: Alcohol use. Tobacco use. Drug use. Emotional well-being. Home and relationship well-being. Sexual activity. Eating habits. History of falls. Memory and ability to understand (cognition). Work and work Statistician. Reproductive health. Screening  You may have the following tests or measurements: Height, weight, and BMI. Blood pressure. Lipid and cholesterol levels. These may be checked every 5 years, or more frequently if you are over 43 years old. Skin check. Lung cancer screening. You may have this screening every year starting at age 51 if you have a 30-pack-year history of smoking and currently smoke or have quit within the past 15 years. Fecal occult blood test (FOBT) of the stool. You may have this test every year starting at age 45. Flexible sigmoidoscopy or colonoscopy. You may have a sigmoidoscopy every 5 years or a colonoscopy every 10 years starting at age 24. Hepatitis C blood test. Hepatitis B blood test. Sexually transmitted disease (STD) testing. Diabetes screening. This is done by checking your blood  sugar (glucose) after you have not eaten for a while (fasting). You may have this done every 1-3 years. Bone density scan. This is done to screen for osteoporosis. You may have this done starting at age 43. Mammogram. This may be done every 1-2 years. Talk to your health care provider about how often you should have regular mammograms. Talk with your health care provider about your test results,  treatment options, and if necessary, the need for more tests. Vaccines  Your health care provider may recommend certain vaccines, such as: Influenza vaccine. This is recommended every year. Tetanus, diphtheria, and acellular pertussis (Tdap, Td) vaccine. You may need a Td booster every 10 years. Zoster vaccine. You may need this after age 68. Pneumococcal 13-valent conjugate (PCV13) vaccine. One dose is recommended after age 78. Pneumococcal polysaccharide (PPSV23) vaccine. One dose is recommended after age 24. Talk to your health care provider about which screenings and vaccines you need and how often you need them. This information is not intended to replace advice given to you by your health care provider. Make sure you discuss any questions you have with your health care provider. Document Released: 11/16/2015 Document Revised: 07/09/2016 Document Reviewed: 08/21/2015 Elsevier Interactive Patient Education  2017 [place] Prevention in the Home Falls can cause injuries. They can happen to people of all ages. There are many things you can do to make your home safe and to help prevent falls. What can I do on the outside of my home? Regularly fix the edges of walkways and driveways and fix any cracks. Remove anything that might make you trip as you walk through a door, such as a raised step or threshold. Trim any bushes or trees on the path to your home. Use bright outdoor lighting. Clear any walking paths of anything that might make someone trip, such as rocks or tools. Regularly check to see if handrails are loose or broken. Make sure that both sides of any steps have handrails. Any raised decks and porches should have guardrails on the edges. Have any leaves, snow, or ice cleared regularly. Use sand or salt on walking paths during winter. Clean up any spills in your garage right away. This includes oil or grease spills. What can I do in the bathroom? Use night  lights. Install grab bars by the toilet and in the tub and shower. Do not use towel bars as grab bars. Use non-skid mats or decals in the tub or shower. If you need to sit down in the shower, use a plastic, non-slip stool. Keep the floor dry. Clean up any water that spills on the floor as soon as it happens. Remove soap buildup in the tub or shower regularly. Attach bath mats securely with double-sided non-slip rug tape. Do not have throw rugs and other things on the floor that can make you trip. What can I do in the bedroom? Use night lights. Make sure that you have a light by your bed that is easy to reach. Do not use any sheets or blankets that are too big for your bed. They should not hang down onto the floor. Have a firm chair that has side arms. You can use this for support while you get dressed. Do not have throw rugs and other things on the floor that can make you trip. What can I do in the kitchen? Clean up any spills right away. Avoid walking on wet floors. Keep items that you use a lot in easy-to-reach  places. If you need to reach something above you, use a strong step stool that has a grab bar. Keep electrical cords out of the way. Do not use floor polish or wax that makes floors slippery. If you must use wax, use non-skid floor wax. Do not have throw rugs and other things on the floor that can make you trip. What can I do with my stairs? Do not leave any items on the stairs. Make sure that there are handrails on both sides of the stairs and use them. Fix handrails that are broken or loose. Make sure that handrails are as long as the stairways. Check any carpeting to make sure that it is firmly attached to the stairs. Fix any carpet that is loose or worn. Avoid having throw rugs at the top or bottom of the stairs. If you do have throw rugs, attach them to the floor with carpet tape. Make sure that you have a light switch at the top of the stairs and the bottom of the stairs. If  you do not have them, ask someone to add them for you. What else can I do to help prevent falls? Wear shoes that: Do not have high heels. Have rubber bottoms. Are comfortable and fit you well. Are closed at the toe. Do not wear sandals. If you use a stepladder: Make sure that it is fully opened. Do not climb a closed stepladder. Make sure that both sides of the stepladder are locked into place. Ask someone to hold it for you, if possible. Clearly mark and make sure that you can see: Any grab bars or handrails. First and last steps. Where the edge of each step is. Use tools that help you move around (mobility aids) if they are needed. These include: Canes. Walkers. Scooters. Crutches. Turn on the lights when you go into a dark area. Replace any light bulbs as soon as they burn out. Set up your furniture so you have a clear path. Avoid moving your furniture around. If any of your floors are uneven, fix them. If there are any pets around you, be aware of where they are. Review your medicines with your doctor. Some medicines can make you feel dizzy. This can increase your chance of falling. Ask your doctor what other things that you can do to help prevent falls. This information is not intended to replace advice given to you by your health care provider. Make sure you discuss any questions you have with your health care provider. Document Released: 08/16/2009 Document Revised: 03/27/2016 Document Reviewed: 11/24/2014 Elsevier Interactive Patient Education  2017 [employer].

## 2022-09-19 NOTE — Progress Notes (Signed)
Subjective:   Kristin Leonard is a 71 y.o. female who presents for Medicare Annual (Subsequent) preventive examination.  Review of Systems    Virtual Visit via Telephone Note  I connected with  Kristin Leonard on 09/19/22 at  9:00 AM EST by telephone and verified that I am speaking with the correct person using two identifiers.  Location: Patient: Home Provider: Office Persons participating in the virtual visit: patient/Nurse Health Advisor   I discussed the limitations, risks, security and privacy concerns of performing an evaluation and management service by telephone and the availability of in person appointments. The patient expressed understanding and agreed to proceed.  Interactive audio and video telecommunications were attempted between this nurse and patient, however failed, due to patient having technical difficulties OR patient did not have access to video capability.  We continued and completed visit with audio only.  Some vital signs may be absent or patient reported.   Kristin Peaches, LPN  Cardiac Risk Factors include: advanced age (>79mn, >>5women)     Objective:    Today's Vitals   09/19/22 0901  Weight: 211 lb (95.7 kg)  Height: '5\' 4"'$  (1.626 m)   Body mass index is 36.22 kg/m.     09/19/2022    9:07 AM  Advanced Directives  Does Patient Have a Medical Advance Directive? No  Would patient like information on creating a medical advance directive? No - Patient declined    Current Medications (verified) Outpatient Encounter Medications as of 09/19/2022  Medication Sig   albuterol (VENTOLIN HFA) 108 (90 Base) MCG/ACT inhaler INHALE 2 PUFFS BY MOUTH EVERY 6 HOURS AS NEEDED FOR WHEEZING   ALPRAZolam (XANAX) 0.5 MG tablet Take 0.5-1 tablets (0.25-0.5 mg total) by mouth at bedtime as needed for anxiety.   augmented betamethasone dipropionate (DIPROLENE-AF) 0.05 % cream Apply a small amount to affected area of hands twice a day for 1-2 weeks as  directed   budesonide-formoterol (SYMBICORT) 80-4.5 MCG/ACT inhaler Inhale 2 puffs into the lungs up to 12 times a day as needed as directed (30 days)   cetirizine (ZYRTEC) 10 MG tablet Take 1 tablet (10 mg total) by mouth daily.   cyanocobalamin (DODEX) 1000 MCG/ML injection Inject 1 ml every 5 weeks.   diltiazem (CARDIZEM CD) 180 MG 24 hr capsule Take 1 capsule (180 mg total) by mouth daily.   fluorometholone (FML) 0.1 % ophthalmic suspension Place 1 drop into both eyes twice a day   fluticasone (FLONASE) 50 MCG/ACT nasal spray Use 1-2 sprays in each nostril once a day or as needed   hydrocortisone 2.5 % cream Apply a small amount to affected area twice a day for 1-2 weeks(eyelids and face).   icosapent Ethyl (VASCEPA) 1 g capsule Take 1 capsule by mouth 2 times daily. (Patient taking differently: Take 1 g by mouth daily.)   Loteprednol Etabonate (LOTEMAX SM) 0.38 % GEL Place 1 drop into both eyes 2 times a day   meclizine (ANTIVERT) 25 MG tablet Take 1 tablet (25 mg total) by mouth 2 (two) times daily as needed for dizziness.   propranolol (INDERAL) 40 MG tablet TAKE 1 TABLET BY MOUTH 3 TIMES DAILY AS NEEDED   simvastatin (ZOCOR) 20 MG tablet TAKE 1 TABLET BY MOUTH AT BEDTIME   sodium fluoride (PREVIDENT 5000 DRY MOUTH) 1.1 % GEL dental gel Use to brush teeth 2 times daily at 12 noon and 4 pm.   SYNTHROID 100 MCG tablet Take 1 tablet (100 mcg total)  by mouth daily before breakfast.   triamcinolone cream (KENALOG) 0.1 % Apply 1 gram to affected area twice a day   No facility-administered encounter medications on file as of 09/19/2022.    Allergies (verified) No known allergies   History: Past Medical History:  Diagnosis Date   ASTHMA 04/26/2007   CORONARY ARTERY SPASM 04/26/2007   HYPOTHYROIDISM 02/07/2010   NECK PAIN 02/01/2008   SUPRAVENTRICULAR TACHYCARDIA, HX OF 04/26/2007   Past Surgical History:  Procedure Laterality Date   BREAST SURGERY     biopsy   LASER ABLATION     x2    TUBAL LIGATION     History reviewed. No pertinent family history. Social History   Socioeconomic History   Marital status: Married    Spouse name: Not on file   Number of children: Not on file   Years of education: Not on file   Highest education level: Not on file  Occupational History   Not on file  Tobacco Use   Smoking status: Never   Smokeless tobacco: Never  Substance and Sexual Activity   Alcohol use: Yes    Alcohol/week: 3.0 standard drinks of alcohol    Types: 3 Glasses of wine per week   Drug use: No   Sexual activity: Not on file  Other Topics Concern   Not on file  Social History Narrative   Not on file   Social Determinants of Health   Financial Resource Strain: Low Risk  (09/19/2022)   Overall Financial Resource Strain (CARDIA)    Difficulty of Paying Living Expenses: Not hard at all  Food Insecurity: No Food Insecurity (09/19/2022)   Hunger Vital Sign    Worried About Running Out of Food in the Last Year: Never true    Ran Out of Food in the Last Year: Never true  Transportation Needs: No Transportation Needs (09/19/2022)   PRAPARE - Hydrologist (Medical): No    Lack of Transportation (Non-Medical): No  Physical Activity: Insufficiently Active (09/19/2022)   Exercise Vital Sign    Days of Exercise per Week: 3 days    Minutes of Exercise per Session: 40 min  Stress: No Stress Concern Present (09/19/2022)   [place]    Feeling of Stress : Not at all  Social Connections: [place] (09/19/2022)   Social Connection and Isolation Panel [NHANES]    Frequency of Communication with Friends and Family: More than three times a week    Frequency of Social Gatherings with Friends and Family: More than three times a week    Attends Religious Services: More than 4 times per year    Active Member of [employer] or Organizations: Yes    Attends Arts administrator: More than 4 times per year    Marital Status: Married    Tobacco Counseling Counseling given: Not Answered   Clinical Intake:  Pre-visit preparation completed: No  Pain : No/denies pain     BMI - recorded: 36.22 Nutritional Status: BMI > 30  Obese Nutritional Risks: None Diabetes: No  How often do you need to have someone help you when you read instructions, pamphlets, or other written materials from your doctor or pharmacy?: 1 - Never  Diabetic?  No  Interpreter Needed?: No  Information entered by :: Rolene Arbour LPN   Activities of Daily Living    09/19/2022    9:06 AM  In your present state of health,  do you have any difficulty performing the following activities:  Hearing? 0  Vision? 0  Difficulty concentrating or making decisions? 0  Walking or climbing stairs? 0  Dressing or bathing? 0  Doing errands, shopping? 0  Preparing Food and eating ? N  Using the Toilet? N  In the past six months, have you accidently leaked urine? N  Do you have problems with loss of bowel control? N  Managing your Medications? N  Managing your Finances? N  Housekeeping or managing your Housekeeping? N    Patient Care Team: [place], Betty G, MD as PCP - General (Family Medicine) Marylynn Pearson, MD as Consulting Physician (Obstetrics and Gynecology)  Indicate any recent Medical Services you may have received from other than [facility] providers in the past year (date may be approximate).     Assessment:   This is a routine wellness examination for [place].  Hearing/Vision screen Hearing Screening - Comments:: Denies hearing difficulties   Vision Screening - Comments::  up to date with routine eye exams with  [place] issues and exercise activities discussed: Exercise limited by: None identified   Goals Addressed               This Visit's Progress     Lose weight (pt-stated)        I want to lose about 20lbs       Depression Screen     09/19/2022    9:05 AM 09/02/2022    3:17 PM 12/11/2021    1:55 PM 06/25/2021    3:05 PM 09/16/2019   10:56 PM  PHQ 2/9 Scores  PHQ - 2 Score 0 0 1 0 0  PHQ- 9 Score 0 3 4      Fall Risk    09/19/2022    9:06 AM 12/11/2021    1:56 PM 12/11/2021    9:19 AM 06/25/2021    3:05 PM 09/16/2019   10:56 PM  [place] in the past year? 0 0 0 0 0  Number falls in past yr: 0 0   0  Injury with Fall? 0 0  0 0  Risk for fall due to : No Fall Risks      Follow up Falls prevention discussed   Education provided Education provided    FALL RISK PREVENTION PERTAINING TO THE HOME:  Any stairs in or around the home? Yes  If so, are there any without handrails? No  Home free of loose throw rugs in walkways, pet beds, electrical cords, etc? Yes Adequate lighting in your home to reduce risk of falls? Yes   ASSISTIVE DEVICES UTILIZED TO PREVENT FALLS:  Life alert? No  Use of a cane, walker or w/c? No  Grab bars in the bathroom? No  Shower chair or bench in shower? No  Elevated toilet seat or a handicapped toilet? No   TIMED UP AND GO:  Was the test performed? No . Audio Visit  Cognitive Function:        09/19/2022    9:07 AM  6CIT Screen  What Year? 0 points  What month? 0 points  What time? 0 points  Count back from 20 0 points  Months in reverse 0 points  Repeat phrase 0 points  Total Score 0 points    Immunizations Immunization History  Administered Date(s) Administered   Fluad Quad(high Dose 65+) 08/03/2019   Influenza Split 09/01/2012   Influenza Whole 09/10/2010   Influenza, High Dose  Seasonal PF 09/14/2018, 08/01/2022   Influenza,inj,Quad PF,6+ Mos 10/30/2014, 12/03/2015, 07/13/2017   Influenza-Unspecified 07/30/2020, 08/19/2021   Pfizer Covid-19 Vaccine Bivalent Booster 17yr & up 08/19/2021   Pneumococcal Polysaccharide-23 09/16/2019   Tdap 12/03/2015   Unspecified SARS-COV-2 Vaccination 11/28/2019, 12/19/2019, 07/30/2020, 02/10/2021    TDAP status: Up to  date  Flu Vaccine status: Up to date  Pneumococcal vaccine status: Declined,  Education has been provided regarding the importance of this vaccine but patient still declined. Advised may receive this vaccine at local pharmacy or Health Dept. Aware to provide a copy of the vaccination record if obtained from local pharmacy or Health Dept. Verbalized acceptance and understanding.   Covid-19 vaccine status: Completed vaccines  Qualifies for Shingles Vaccine? Yes   Zostavax completed No   Shingrix Completed?: No.    Education has been provided regarding the importance of this vaccine. Patient has been advised to call insurance company to determine out of pocket expense if they have not yet received this vaccine. Advised may also receive vaccine at local pharmacy or Health Dept. Verbalized acceptance and understanding.  Screening Tests Health Maintenance  Topic Date Due   COVID-19 Vaccine (6 - Mixed Product series) 10/05/2022 (Originally 12/20/2021)   Zoster Vaccines- Shingrix (1 of 2) 11/07/2022 (Originally 07/27/2002)   Pneumonia Vaccine 70 Years old (2 - PCV) 09/20/2023 (Originally 09/15/2020)   MAMMOGRAM  09/20/2023 (Originally 01/18/2021)   Fecal DNA (Cologuard)  11/13/2022   Medicare Annual Wellness (AWV)  09/20/2023   TETANUS/TDAP  12/02/2025   INFLUENZA VACCINE  Completed   DEXA SCAN  Completed   Hepatitis C Screening  Completed   HPV VACCINES  Aged Out   COLONOSCOPY (Pts 45-45yrInsurance coverage will need to be confirmed)  Discontinued    Health Maintenance  There are no preventive care reminders to display for this patient.   Colorectal cancer screening: Type of screening: FOBT/FIT. Completed 11/14/19. Repeat every 3 years  Mammogram status: Ordered Patient deferred. Pt provided with contact info and advised to call to schedule appt.     Lung Cancer Screening: (Low Dose CT Chest recommended if Age 70-80ears, 30 pack-year currently smoking OR have quit w/in 15years.)  does not qualify.     Additional Screening:  Hepatitis C Screening: does qualify; Completed 07/13/17  Vision Screening: Recommended annual ophthalmology exams for early detection of glaucoma and other disorders of the eye. Is the patient up to date with their annual eye exam?  Yes  Who is the provider or what is the name of the office in which the patient attends annual eye exams[facility], would they like to be referred to a provider to establish care? No .   Dental Screening: Recommended annual dental exams for proper oral hygiene  Community Resource Referral / Chronic Care Management: CRR required this visit?  No   CCM required this visit?  No      Plan:     I have personally reviewed and noted the following in the patient's chart:   Medical and social history Use of alcohol, tobacco or illicit drugs  Current medications and supplements including opioid prescriptions. Patient is not currently taking opioid prescriptions. Functional ability and status Nutritional status Physical activity Advanced directives List of other physicians Hospitalizations, surgeries, and ER visits in previous 12 months Vitals Screenings to include cognitive, depression, and falls Referrals and appointments  In addition, I have reviewed and discussed with patient certain preventive protocols,  quality metrics, and best practice recommendations. A written personalized care plan for preventive services as well as general preventive health recommendations were provided to patient.     Kristin Peaches, LPN   [id number]   Nurse Notes: Satira Sark

## 2022-09-22 ENCOUNTER — Other Ambulatory Visit (HOSPITAL_COMMUNITY): Payer: Self-pay

## 2022-09-28 ENCOUNTER — Other Ambulatory Visit (HOSPITAL_COMMUNITY): Payer: Self-pay

## 2022-09-29 ENCOUNTER — Other Ambulatory Visit (HOSPITAL_COMMUNITY): Payer: Self-pay

## 2022-10-01 ENCOUNTER — Other Ambulatory Visit (HOSPITAL_COMMUNITY): Payer: Self-pay

## 2022-10-08 ENCOUNTER — Other Ambulatory Visit (HOSPITAL_COMMUNITY): Payer: Self-pay

## 2022-10-13 ENCOUNTER — Other Ambulatory Visit (HOSPITAL_COMMUNITY): Payer: Self-pay

## 2022-10-28 ENCOUNTER — Other Ambulatory Visit (HOSPITAL_COMMUNITY): Payer: Self-pay

## 2022-11-06 ENCOUNTER — Other Ambulatory Visit (INDEPENDENT_AMBULATORY_CARE_PROVIDER_SITE_OTHER): Payer: Medicare HMO

## 2022-11-06 DIAGNOSIS — E890 Postprocedural hypothyroidism: Secondary | ICD-10-CM

## 2022-11-06 DIAGNOSIS — I471 Supraventricular tachycardia, unspecified: Secondary | ICD-10-CM | POA: Diagnosis not present

## 2022-11-06 DIAGNOSIS — E782 Mixed hyperlipidemia: Secondary | ICD-10-CM | POA: Diagnosis not present

## 2022-11-06 LAB — BASIC METABOLIC PANEL
BUN: 23 mg/dL (ref 6–23)
CO2: 24 mEq/L (ref 19–32)
Calcium: 9.4 mg/dL (ref 8.4–10.5)
Chloride: 105 mEq/L (ref 96–112)
Creatinine, Ser: 0.91 mg/dL (ref 0.40–1.20)
GFR: 64.02 mL/min (ref >60.00)
Glucose, Bld: 118 mg/dL — ABNORMAL HIGH (ref 70–99)
Potassium: 4 mEq/L (ref 3.5–5.1)
Sodium: 137 mEq/L (ref 135–145)

## 2022-11-06 LAB — LIPID PANEL
Cholesterol: 167 mg/dL (ref 0–200)
HDL: 53.4 mg/dL (ref >39.00)
NonHDL: 113.51
Total CHOL/HDL Ratio: 3
Triglycerides: 216 mg/dL — ABNORMAL HIGH (ref 0.0–149.0)
VLDL: 43.2 mg/dL — ABNORMAL HIGH (ref 0.0–40.0)

## 2022-11-06 LAB — LDL CHOLESTEROL, DIRECT: Direct LDL: 83 mg/dL

## 2022-11-06 LAB — HEPATIC FUNCTION PANEL
ALT: 23 U/L (ref 0–35)
AST: 26 U/L (ref 0–37)
Albumin: 4.2 g/dL (ref 3.5–5.2)
Alkaline Phosphatase: 113 U/L (ref 39–117)
Bilirubin, Direct: 0.1 mg/dL (ref 0.0–0.3)
Total Bilirubin: 0.8 mg/dL (ref 0.2–1.2)
Total Protein: 7.3 g/dL (ref 6.0–8.3)

## 2022-11-06 LAB — TSH: TSH: 2.26 u[IU]/mL (ref 0.35–5.50)

## 2022-11-07 ENCOUNTER — Encounter: Payer: Self-pay | Admitting: Family Medicine

## 2022-11-26 ENCOUNTER — Other Ambulatory Visit: Payer: Self-pay

## 2022-12-25 ENCOUNTER — Other Ambulatory Visit: Payer: Self-pay | Admitting: Family Medicine

## 2022-12-25 ENCOUNTER — Other Ambulatory Visit (HOSPITAL_COMMUNITY): Payer: Self-pay

## 2022-12-25 ENCOUNTER — Other Ambulatory Visit: Payer: Self-pay

## 2022-12-26 ENCOUNTER — Other Ambulatory Visit (HOSPITAL_COMMUNITY): Payer: Self-pay

## 2022-12-26 MED ORDER — SIMVASTATIN 20 MG PO TABS
ORAL_TABLET | Freq: Every day | ORAL | 1 refills | Status: DC
  Filled 2022-12-26: qty 90, 90d supply, fill #0
  Filled 2023-04-21: qty 90, 90d supply, fill #1

## 2023-01-30 LAB — HM PAP SMEAR: HPV, high-risk: NEGATIVE

## 2023-02-22 ENCOUNTER — Other Ambulatory Visit: Payer: Self-pay | Admitting: Family Medicine

## 2023-02-22 DIAGNOSIS — F419 Anxiety disorder, unspecified: Secondary | ICD-10-CM

## 2023-02-22 DIAGNOSIS — E890 Postprocedural hypothyroidism: Secondary | ICD-10-CM

## 2023-02-22 DIAGNOSIS — E782 Mixed hyperlipidemia: Secondary | ICD-10-CM

## 2023-02-23 ENCOUNTER — Other Ambulatory Visit (HOSPITAL_COMMUNITY): Payer: Self-pay

## 2023-02-23 ENCOUNTER — Other Ambulatory Visit: Payer: Self-pay

## 2023-02-23 MED ORDER — ALPRAZOLAM 0.5 MG PO TABS
0.2500 mg | ORAL_TABLET | Freq: Every evening | ORAL | 2 refills | Status: DC | PRN
  Filled 2023-02-23: qty 30, 30d supply, fill #0
  Filled 2023-05-18: qty 30, 30d supply, fill #1
  Filled 2023-07-10: qty 30, 30d supply, fill #2

## 2023-02-23 MED ORDER — SYNTHROID 100 MCG PO TABS
100.0000 ug | ORAL_TABLET | Freq: Every day | ORAL | 1 refills | Status: DC
  Filled 2023-02-23: qty 90, 90d supply, fill #0
  Filled 2023-05-27: qty 90, 90d supply, fill #1

## 2023-02-23 MED ORDER — ICOSAPENT ETHYL 1 G PO CAPS
1.0000 g | ORAL_CAPSULE | Freq: Two times a day (BID) | ORAL | 1 refills | Status: DC
  Filled 2023-02-23: qty 180, 90d supply, fill #0
  Filled 2023-08-25: qty 180, 90d supply, fill #1

## 2023-02-24 ENCOUNTER — Other Ambulatory Visit: Payer: Self-pay

## 2023-02-24 ENCOUNTER — Other Ambulatory Visit (HOSPITAL_COMMUNITY): Payer: Self-pay

## 2023-02-25 ENCOUNTER — Other Ambulatory Visit (HOSPITAL_COMMUNITY): Payer: Self-pay

## 2023-03-25 ENCOUNTER — Other Ambulatory Visit (HOSPITAL_COMMUNITY): Payer: Self-pay

## 2023-03-25 ENCOUNTER — Other Ambulatory Visit: Payer: Self-pay | Admitting: Family Medicine

## 2023-03-25 DIAGNOSIS — E538 Deficiency of other specified B group vitamins: Secondary | ICD-10-CM

## 2023-03-25 MED ORDER — CYANOCOBALAMIN 1000 MCG/ML IJ SOLN
INTRAMUSCULAR | 2 refills | Status: DC
  Filled 2023-03-25: qty 3, 105d supply, fill #0
  Filled 2023-08-01: qty 3, 105d supply, fill #1
  Filled 2023-10-13: qty 3, 105d supply, fill #2

## 2023-03-26 ENCOUNTER — Other Ambulatory Visit (HOSPITAL_COMMUNITY): Payer: Self-pay

## 2023-04-21 ENCOUNTER — Other Ambulatory Visit: Payer: Self-pay

## 2023-04-21 ENCOUNTER — Other Ambulatory Visit: Payer: Self-pay | Admitting: Family Medicine

## 2023-04-21 ENCOUNTER — Other Ambulatory Visit (HOSPITAL_COMMUNITY): Payer: Self-pay

## 2023-04-21 DIAGNOSIS — Z01419 Encounter for gynecological examination (general) (routine) without abnormal findings: Secondary | ICD-10-CM | POA: Diagnosis not present

## 2023-04-21 DIAGNOSIS — Z6835 Body mass index (BMI) 35.0-35.9, adult: Secondary | ICD-10-CM | POA: Diagnosis not present

## 2023-04-21 DIAGNOSIS — Z1231 Encounter for screening mammogram for malignant neoplasm of breast: Secondary | ICD-10-CM | POA: Diagnosis not present

## 2023-04-21 DIAGNOSIS — F32A Depression, unspecified: Secondary | ICD-10-CM | POA: Diagnosis not present

## 2023-04-21 LAB — HM MAMMOGRAPHY

## 2023-04-21 MED ORDER — SERTRALINE HCL 50 MG PO TABS
50.0000 mg | ORAL_TABLET | Freq: Every day | ORAL | 3 refills | Status: DC
  Filled 2023-04-21: qty 90, 90d supply, fill #0

## 2023-04-21 MED ORDER — ALBUTEROL SULFATE HFA 108 (90 BASE) MCG/ACT IN AERS
2.0000 | INHALATION_SPRAY | Freq: Four times a day (QID) | RESPIRATORY_TRACT | 4 refills | Status: DC | PRN
  Filled 2023-04-21: qty 6.7, 25d supply, fill #0
  Filled 2023-05-27: qty 6.7, 25d supply, fill #1
  Filled 2024-04-12: qty 6.7, 25d supply, fill #2

## 2023-04-21 MED ORDER — ALPRAZOLAM 0.5 MG PO TABS
0.5000 mg | ORAL_TABLET | Freq: Every day | ORAL | 1 refills | Status: DC | PRN
  Filled 2023-04-21: qty 30, 30d supply, fill #0
  Filled 2023-08-01 – 2023-08-06 (×2): qty 30, 30d supply, fill #1

## 2023-05-18 ENCOUNTER — Other Ambulatory Visit: Payer: Self-pay

## 2023-05-27 ENCOUNTER — Other Ambulatory Visit: Payer: Self-pay

## 2023-05-28 ENCOUNTER — Other Ambulatory Visit (HOSPITAL_COMMUNITY): Payer: Self-pay

## 2023-06-25 DIAGNOSIS — F339 Major depressive disorder, recurrent, unspecified: Secondary | ICD-10-CM | POA: Diagnosis not present

## 2023-07-09 DIAGNOSIS — F339 Major depressive disorder, recurrent, unspecified: Secondary | ICD-10-CM | POA: Diagnosis not present

## 2023-07-10 ENCOUNTER — Other Ambulatory Visit: Payer: Self-pay

## 2023-07-10 ENCOUNTER — Other Ambulatory Visit (HOSPITAL_COMMUNITY): Payer: Self-pay

## 2023-07-14 DIAGNOSIS — F339 Major depressive disorder, recurrent, unspecified: Secondary | ICD-10-CM | POA: Diagnosis not present

## 2023-07-14 NOTE — Progress Notes (Unsigned)
ACUTE VISIT No chief complaint on file.  HPI: [name] is a 72 y.o. female, who is here today complaining of *** HPI  Review of Systems See other pertinent positives and negatives in HPI.  Current Outpatient Medications on File Prior to Visit  Medication Sig Dispense Refill   albuterol (VENTOLIN HFA) 108 (90 Base) MCG/ACT inhaler Inhale 2 puffs into the lungs every 6 (six) hours as needed for wheezing. 6.7 g 4   ALPRAZolam (XANAX) 0.5 MG tablet Take 0.5-1 tablets (0.25-0.5 mg total) by mouth at bedtime as needed for anxiety. 30 tablet 2   ALPRAZolam (XANAX) 0.5 MG tablet Take 1 tablet (0.5 mg total) by mouth daily as needed. 30 tablet 1   augmented betamethasone dipropionate (DIPROLENE-AF) 0.05 % cream Apply a small amount to affected area of hands twice a day for 1-2 weeks as directed 50 g 1   budesonide-formoterol (SYMBICORT) 80-4.5 MCG/ACT inhaler Inhale 2 puffs into the lungs up to 12 times a day as needed as directed (30 days) 10.2 g 3   cetirizine (ZYRTEC) 10 MG tablet Take 1 tablet (10 mg total) by mouth daily. 30 tablet 0   cyanocobalamin (DODEX) 1000 MCG/ML injection Inject 1 ml intramuscularly every 5 weeks. 3 mL 2   diltiazem (CARDIZEM CD) 180 MG 24 hr capsule Take 1 capsule (180 mg total) by mouth daily. 90 capsule 2   fluorometholone (FML) 0.1 % ophthalmic suspension Place 1 drop into both eyes twice a day 5 mL 0   fluticasone (FLONASE) 50 MCG/ACT nasal spray Use 1-2 sprays in each nostril once a day or as needed 16 g 5   hydrocortisone 2.5 % cream Apply a small amount to affected area twice a day for 1-2 weeks(eyelids and face). 30 g 1   icosapent Ethyl (VASCEPA) 1 g capsule Take 1 capsule by mouth 2 times daily. 180 capsule 1   Loteprednol Etabonate (LOTEMAX SM) 0.38 % GEL Place 1 drop into both eyes 2 times a day 5 g 1   meclizine (ANTIVERT) 25 MG tablet Take 1 tablet (25 mg total) by mouth 2 (two) times daily as needed for dizziness. 30 tablet 1    propranolol (INDERAL) 40 MG tablet TAKE 1 TABLET BY MOUTH 3 TIMES DAILY AS NEEDED 90 tablet 3   sertraline (ZOLOFT) 50 MG tablet Take 1 tablet (50 mg total) by mouth daily. 90 tablet 3   simvastatin (ZOCOR) 20 MG tablet TAKE 1 TABLET BY MOUTH AT BEDTIME 90 tablet 1   sodium fluoride (PREVIDENT 5000 DRY MOUTH) 1.1 % GEL dental gel Use to brush teeth 2 times daily at 12 noon and 4 pm. 100 mL 5   SYNTHROID 100 MCG tablet Take 1 tablet (100 mcg total) by mouth daily before breakfast. 90 tablet 1   triamcinolone cream (KENALOG) 0.1 % Apply 1 gram to affected area twice a day 80 g 1   No current facility-administered medications on file prior to visit.    Past Medical History:  Diagnosis Date   ASTHMA 04/26/2007   CORONARY ARTERY SPASM 04/26/2007   HYPOTHYROIDISM 02/07/2010   NECK PAIN 02/01/2008   SUPRAVENTRICULAR TACHYCARDIA, HX OF 04/26/2007   Allergies  Allergen Reactions   No Known Allergies     Social History   Socioeconomic History   Marital status: Married    Spouse name: Not on file   Number of children: Not on file   Years of education: Not on file   Highest education level: Not  on file  Occupational History   Not on file  Tobacco Use   Smoking status: Never   Smokeless tobacco: Never  Substance and Sexual Activity   Alcohol use: Yes    Alcohol/week: 3.0 standard drinks of alcohol    Types: 3 Glasses of wine per week   Drug use: No   Sexual activity: Not on file  Other Topics Concern   Not on file  Social History Narrative   Not on file   Social Determinants of Health   Financial Resource Strain: Low Risk  (09/19/2022)   Overall Financial Resource Strain (CARDIA)    Difficulty of Paying Living Expenses: Not hard at all  Food Insecurity: No Food Insecurity (09/19/2022)   Hunger Vital Sign    Worried About Running Out of Food in the Last Year: Never true    Ran Out of Food in the Last Year: Never true  Transportation Needs: No Transportation Needs (09/19/2022)    PRAPARE - Administrator, Civil Service (Medical): No    Lack of Transportation (Non-Medical): No  Physical Activity: Insufficiently Active (09/19/2022)   Exercise Vital Sign    Days of Exercise per Week: 3 days    Minutes of Exercise per Session: 40 min  Stress: No Stress Concern Present (09/19/2022)   [employer] of Occupational Health - Occupational Stress Questionnaire    Feeling of Stress : Not at all  Social Connections: Socially Integrated (09/19/2022)   Social Connection and Isolation Panel [NHANES]    Frequency of Communication with Friends and Family: More than three times a week    Frequency of Social Gatherings with Friends and Family: More than three times a week    Attends Religious Services: More than 4 times per year    Active Member of [employer] or Organizations: Yes    Attends Engineer, structural: More than 4 times per year    Marital Status: Married    There were no vitals filed for this visit. There is no height or weight on file to calculate BMI.  Physical Exam  ASSESSMENT AND PLAN: There are no diagnoses linked to this encounter.  No follow-ups on file.  Dyna Figuereo G. [place], MD  [facility]. [place] office.  Discharge Instructions   None

## 2023-07-15 ENCOUNTER — Other Ambulatory Visit: Payer: Self-pay

## 2023-07-15 ENCOUNTER — Other Ambulatory Visit (HOSPITAL_COMMUNITY): Payer: Self-pay

## 2023-07-15 ENCOUNTER — Encounter: Payer: Self-pay | Admitting: Family Medicine

## 2023-07-15 ENCOUNTER — Ambulatory Visit (INDEPENDENT_AMBULATORY_CARE_PROVIDER_SITE_OTHER): Payer: Medicare HMO | Admitting: Family Medicine

## 2023-07-15 VITALS — BP 126/70 | HR 70 | Temp 97.9°F | Resp 16 | Ht 64.0 in | Wt 203.4 lb

## 2023-07-15 DIAGNOSIS — E782 Mixed hyperlipidemia: Secondary | ICD-10-CM

## 2023-07-15 DIAGNOSIS — E890 Postprocedural hypothyroidism: Secondary | ICD-10-CM | POA: Diagnosis not present

## 2023-07-15 DIAGNOSIS — E538 Deficiency of other specified B group vitamins: Secondary | ICD-10-CM

## 2023-07-15 DIAGNOSIS — R739 Hyperglycemia, unspecified: Secondary | ICD-10-CM | POA: Diagnosis not present

## 2023-07-15 DIAGNOSIS — I471 Supraventricular tachycardia, unspecified: Secondary | ICD-10-CM

## 2023-07-15 DIAGNOSIS — F419 Anxiety disorder, unspecified: Secondary | ICD-10-CM

## 2023-07-15 LAB — POCT GLYCOSYLATED HEMOGLOBIN (HGB A1C): Hemoglobin A1C: 5.6 % (ref 4.0–5.6)

## 2023-07-15 MED ORDER — DILTIAZEM HCL ER COATED BEADS 180 MG PO CP24
180.0000 mg | ORAL_CAPSULE | Freq: Every day | ORAL | 2 refills | Status: DC
Start: 2023-07-15 — End: 2024-04-12
  Filled 2023-07-15: qty 90, 90d supply, fill #0
  Filled 2023-10-13: qty 90, 90d supply, fill #1
  Filled 2024-01-18: qty 90, 90d supply, fill #2

## 2023-07-15 MED ORDER — FLUOXETINE HCL 20 MG PO CAPS
20.0000 mg | ORAL_CAPSULE | Freq: Every day | ORAL | 0 refills | Status: DC
Start: 2023-07-15 — End: 2023-09-24
  Filled 2023-07-15: qty 90, 90d supply, fill #0

## 2023-07-15 MED ORDER — SYNTHROID 100 MCG PO TABS
100.0000 ug | ORAL_TABLET | Freq: Every day | ORAL | 1 refills | Status: DC
Start: 2023-07-15 — End: 2024-02-25
  Filled 2023-07-15 – 2023-08-25 (×2): qty 90, 90d supply, fill #0
  Filled 2023-11-24: qty 90, 90d supply, fill #1

## 2023-07-15 MED ORDER — PROPRANOLOL HCL 40 MG PO TABS
ORAL_TABLET | Freq: Three times a day (TID) | ORAL | 3 refills | Status: AC | PRN
Start: 2023-07-15 — End: 2024-07-14
  Filled 2023-07-15 – 2023-09-24 (×2): qty 90, 30d supply, fill #0
  Filled 2024-02-01: qty 90, 30d supply, fill #1
  Filled 2024-05-19: qty 90, 30d supply, fill #2

## 2023-07-15 MED ORDER — SIMVASTATIN 20 MG PO TABS
20.0000 mg | ORAL_TABLET | Freq: Every day | ORAL | 1 refills | Status: DC
Start: 2023-07-15 — End: 2024-01-06
  Filled 2023-07-15: qty 90, 90d supply, fill #0
  Filled 2023-10-13: qty 90, 90d supply, fill #1

## 2023-07-15 NOTE — Patient Instructions (Addendum)
A few things to remember from today's visit:  Mixed hyperlipidemia  B12 deficiency  Anxiety disorder, unspecified type  Paroxysmal supraventricular tachycardia, Chronic - Plan: propranolol (INDERAL) 40 MG tablet  Hyperglycemia - Plan: POC HgB A1c  Paroxysmal supraventricular tachycardia - Plan: propranolol (INDERAL) 40 MG tablet  Postablative hypothyroidism - Plan: SYNTHROID 100 MCG tablet  Sertraline discontinued. Fluoxetine started today.  If you need refills for medications you take chronically, please call your pharmacy. Do not use My Chart to request refills or for acute issues that need immediate attention. If you send a my chart message, it may take a few days to be addressed, specially if I am not in the office.  Please be sure medication list is accurate. If a new problem present, please set up appointment sooner than planned today.

## 2023-07-15 NOTE — Assessment & Plan Note (Signed)
Worse recently with the death of her son. She does not feel the sertraline is helping, she is interested in trying Prozac. Stop sertraline 50 mg (started in 04/2023) and start Prozac 20 mg daily. We discussed some side effects. Continue alprazolam 0.5 mg daily as needed. She was instructed to let me know if medication is helping in about 6 to 8 weeks. Continue CBT.

## 2023-07-15 NOTE — Assessment & Plan Note (Signed)
Continue B12 at 1000 mcg IM q. 5 weeks. Will check B12 in 09/2023 and recommendation will be given according to results.

## 2023-07-15 NOTE — Assessment & Plan Note (Signed)
Last TSH normal, 2.2 in 11/2022. She would like to have labs in 09/2023, TSH and free T4 orders placed. Continue Synthroid at 100 mcg daily.

## 2023-07-15 NOTE — Progress Notes (Signed)
ACUTE VISIT Chief Complaint  Patient presents with   grief    Son passed away, has been seeing a grief counselor who recommend Prozac.    HPI: Kristin Leonard is a 72 y.o. female with a PMHx of HLD, hypothyroidism,  anxiety, vertigo , who is here today complaining of grief.   Grief: She is currently grieving her son who suddenly passed away after fracturing his C2 following a fall.  She states during a scheduled mammogram at her ob/gyn, she was prescribed Zoloft.  She has been taking Zoloft since 6/18, but notes she has not noticed a significant difference.  She reports that she continues to "feel numb".  She has been meeting with a counselor once a week for 1 hour sessions.  She last met with her counselor yesterday, who suggested Prozac.  Overall she is sleeping well at night, stating she takes Advil PM and Xanax 0.5 mg as needed for anxiety.  No SI/HI.   She would like to have blood work done in a couple months.  -B12 deficiency: Currently she is on B12 1000 mcg IM every 5 weeks. Lab Results  Component Value Date   VITAMINB12 578 08/19/2022   -Hypothyroidism: Currently she is on Synthroid 100 mcg daily. Lab Results  Component Value Date   TSH 2.26 11/06/2022   Hyperlipidemia: Currently she is on simvastatin 20 mg daily. Lab Results  Component Value Date   CHOL 167 11/06/2022   HDL 53.40 11/06/2022   LDLCALC 104 11/15/2015   LDLDIRECT 83.0 11/06/2022   TRIG 216.0 (H) 11/06/2022   CHOLHDL 3 11/06/2022   Glucose mildly elevated in 11/2022 at 118, fasting. No history of diabetes.  -Supraventricular tachycardia: She is currently on propranolol 40 mg twice daily as needed. She also takes diltiazem 180 mg daily. Negative for CP, dyspnea, diaphoresis, or syncope. Lab Results  Component Value Date   NA 137 11/06/2022   CL 105 11/06/2022   K 4.0 11/06/2022   CO2 24 11/06/2022   BUN 23 11/06/2022   CREATININE 0.91 11/06/2022   GFR 64.02 11/06/2022   CALCIUM 9.4  11/06/2022   ALBUMIN 4.2 11/06/2022   GLUCOSE 118 (H) 11/06/2022   Review of Systems  Constitutional:  Negative for chills and fever.  HENT:  Negative for nosebleeds and sore throat.   Respiratory:  Negative for cough and wheezing.   Gastrointestinal:  Negative for abdominal pain, nausea and vomiting.  Endocrine: Negative for polydipsia, polyphagia and polyuria.  Genitourinary:  Negative for decreased urine volume, dysuria and hematuria.  Neurological:  Negative for weakness and headaches.  Psychiatric/Behavioral:  Negative for confusion and hallucinations.   See other pertinent positives and negatives in HPI.   Current Outpatient Medications on File Prior to Visit  Medication Sig Dispense Refill   albuterol (VENTOLIN HFA) 108 (90 Base) MCG/ACT inhaler Inhale 2 puffs into the lungs every 6 (six) hours as needed for wheezing. 6.7 g 4   ALPRAZolam (XANAX) 0.5 MG tablet Take 0.5-1 tablets (0.25-0.5 mg total) by mouth at bedtime as needed for anxiety. 30 tablet 2   ALPRAZolam (XANAX) 0.5 MG tablet Take 1 tablet (0.5 mg total) by mouth daily as needed. 30 tablet 1   augmented betamethasone dipropionate (DIPROLENE-AF) 0.05 % cream Apply a small amount to affected area of hands twice a day for 1-2 weeks as directed 50 g 1   budesonide-formoterol (SYMBICORT) 80-4.5 MCG/ACT inhaler Inhale 2 puffs into the lungs up to 12 times a day as needed as  directed (30 days) 10.2 g 3   cyanocobalamin (DODEX) 1000 MCG/ML injection Inject 1 ml intramuscularly every 5 weeks. 3 mL 2   fluorometholone (FML) 0.1 % ophthalmic suspension Place 1 drop into both eyes twice a day 5 mL 0   fluticasone (FLONASE) 50 MCG/ACT nasal spray Use 1-2 sprays in each nostril once a day or as needed 16 g 5   hydrocortisone 2.5 % cream Apply a small amount to affected area twice a day for 1-2 weeks(eyelids and face). 30 g 1   icosapent Ethyl (VASCEPA) 1 g capsule Take 1 capsule by mouth 2 times daily. 180 capsule 1   meclizine  (ANTIVERT) 25 MG tablet Take 1 tablet (25 mg total) by mouth 2 (two) times daily as needed for dizziness. 30 tablet 1   sodium fluoride (PREVIDENT 5000 DRY MOUTH) 1.1 % GEL dental gel Use to brush teeth 2 times daily at 12 noon and 4 pm. 100 mL 5   triamcinolone cream (KENALOG) 0.1 % Apply 1 gram to affected area twice a day 80 g 1   No current facility-administered medications on file prior to visit.    Past Medical History:  Diagnosis Date   ASTHMA 04/26/2007   CORONARY ARTERY SPASM 04/26/2007   HYPOTHYROIDISM 02/07/2010   NECK PAIN 02/01/2008   SUPRAVENTRICULAR TACHYCARDIA, HX OF 04/26/2007   Allergies  Allergen Reactions   No Known Allergies     Social History   Socioeconomic History   Marital status: Married    Spouse name: Not on file   Number of children: Not on file   Years of education: Not on file   Highest education level: Not on file  Occupational History   Not on file  Tobacco Use   Smoking status: Never   Smokeless tobacco: Never  Substance and Sexual Activity   Alcohol use: Yes    Alcohol/week: 3.0 standard drinks of alcohol    Types: 3 Glasses of wine per week   Drug use: No   Sexual activity: Not on file  Other Topics Concern   Not on file  Social History Narrative   Not on file   Social Determinants of Health   Financial Resource Strain: Low Risk  (09/19/2022)   Overall Financial Resource Strain (CARDIA)    Difficulty of Paying Living Expenses: Not hard at all  Food Insecurity: No Food Insecurity (09/19/2022)   Hunger Vital Sign    Worried About Running Out of Food in the Last Year: Never true    Ran Out of Food in the Last Year: Never true  Transportation Needs: No Transportation Needs (09/19/2022)   PRAPARE - Administrator, Civil Service (Medical): No    Lack of Transportation (Non-Medical): No  Physical Activity: Insufficiently Active (09/19/2022)   Exercise Vital Sign    Days of Exercise per Week: 3 days    Minutes of Exercise  per Session: 40 min  Stress: No Stress Concern Present (09/19/2022)   [employer] of Occupational Health - Occupational Stress Questionnaire    Feeling of Stress : Not at all  Social Connections: Socially Integrated (09/19/2022)   Social Connection and Isolation Panel [NHANES]    Frequency of Communication with Friends and Family: More than three times a week    Frequency of Social Gatherings with Friends and Family: More than three times a week    Attends Religious Services: More than 4 times per year    Active Member of [employer] or Organizations: Yes  Attends Banker Meetings: More than 4 times per year    Marital Status: Married    Vitals:   07/15/23 0948  BP: 126/70  Pulse: 70  Resp: 16  Temp: 97.9 F (36.6 C)  SpO2: 97%   Body mass index is 34.91 kg/m.  Physical Exam Vitals and nursing note reviewed.  Constitutional:      General: She is not in acute distress.    Appearance: She is well-developed.  HENT:     Head: Normocephalic and atraumatic.  Eyes:     Conjunctiva/sclera: Conjunctivae normal.  Cardiovascular:     Rate and Rhythm: Normal rate and regular rhythm.     Pulses:          Dorsalis pedis pulses are 2+ on the right side and 2+ on the left side.     Heart sounds: No murmur heard. Pulmonary:     Effort: Pulmonary effort is normal. No respiratory distress.     Breath sounds: Normal breath sounds.  Abdominal:     Palpations: Abdomen is soft. There is no hepatomegaly or mass.     Tenderness: There is no abdominal tenderness.  Skin:    General: Skin is warm.     Findings: No erythema or rash.  Neurological:     General: No focal deficit present.     Mental Status: She is alert and oriented to person, place, and time.     Cranial Nerves: No cranial nerve deficit.     Gait: Gait normal.  Psychiatric:        Mood and Affect: Mood and affect normal.   ASSESSMENT AND PLAN:  Ms. Giarraputo was seen today for grief.  Lab Results   Component Value Date   HGBA1C 5.6 07/15/2023   Mixed hyperlipidemia Assessment & Plan: Continue simvastatin 20 mg daily and low-fat diet. Will plan on fasting lipid panel in 09/2023.  Orders: -     Simvastatin; Take 1 tablet (20 mg total) by mouth at bedtime.  Dispense: 90 tablet; Refill: 1 -     Lipid panel; Future  B12 deficiency Assessment & Plan: Continue B12 at 1000 mcg IM q. 5 weeks. Will check B12 in 09/2023 and recommendation will be given according to results.  Orders: -     Vitamin B12; Future  Anxiety disorder, unspecified type Assessment & Plan: Worse recently with the death of her son. She does not feel the sertraline is helping, she is interested in trying Prozac. Stop sertraline 50 mg (started in 04/2023) and start Prozac 20 mg daily. We discussed some side effects. Continue alprazolam 0.5 mg daily as needed. She was instructed to let me know if medication is helping in about 6 to 8 weeks. Continue CBT.  Orders: -     FLUoxetine HCl; Take 1 capsule (20 mg total) by mouth daily.  Dispense: 90 capsule; Refill: 0  Paroxysmal supraventricular tachycardia Assessment & Plan: Stable. Continue Propranolol 40 mg twice daily as needed and Diltiazem 180 mg daily.  Orders: -     dilTIAZem HCl ER Coated Beads; Take 1 capsule (180 mg total) by mouth daily.  Dispense: 90 capsule; Refill: 2 -     Propranolol HCl; TAKE 1 TABLET BY MOUTH 3 TIMES DAILY AS NEEDED  Dispense: 90 tablet; Refill: 3 -     Basic metabolic panel; Future  Hyperglycemia IFG. Consistency with a healthy lifestyle encouraged for diabetes prevention.  -     POCT glycosylated hemoglobin (Hb A1C)  Paroxysmal supraventricular  tachycardia Assessment & Plan: Stable. Continue Propranolol 40 mg twice daily as needed and Diltiazem 180 mg daily.  Orders: -     dilTIAZem HCl ER Coated Beads; Take 1 capsule (180 mg total) by mouth daily.  Dispense: 90 capsule; Refill: 2 -     Propranolol HCl; TAKE 1  TABLET BY MOUTH 3 TIMES DAILY AS NEEDED  Dispense: 90 tablet; Refill: 3 -     Basic metabolic panel; Future  Postablative hypothyroidism Assessment & Plan: Last TSH normal, 2.2 in 11/2022. She would like to have labs in 09/2023, TSH and free T4 orders placed. Continue Synthroid at 100 mcg daily.  Orders: -     Synthroid; Take 1 tablet (100 mcg total) by mouth daily before breakfast.  Dispense: 90 tablet; Refill: 1 -     TSH; Future -     T4, free; Future   Return in about 6 months (around 01/12/2024) for labs in 09/2023.  I,Rachel Rivera,acting as a scribe for Truth Barot [place], MD.,have documented all relevant documentation on the behalf of Kristin Meckler [place], MD,as directed by  Blanton Kardell [place], MD while in the presence of Annaliz Aven [place], MD.  I, Rheanne Cortopassi [place], MD, have reviewed all documentation for this visit. The documentation on 07/15/23 for the exam, diagnosis, procedures, and orders are all accurate and complete.  Ricardo Schubach G. [place], MD  [facility]. [place] office.

## 2023-07-15 NOTE — Assessment & Plan Note (Signed)
Continue simvastatin 20 mg daily and low-fat diet. Will plan on fasting lipid panel in 09/2023.

## 2023-07-15 NOTE — Assessment & Plan Note (Signed)
Stable. Continue Propranolol 40 mg twice daily as needed and Diltiazem 180 mg daily.

## 2023-07-21 DIAGNOSIS — F339 Major depressive disorder, recurrent, unspecified: Secondary | ICD-10-CM | POA: Diagnosis not present

## 2023-08-03 ENCOUNTER — Other Ambulatory Visit: Payer: Self-pay

## 2023-08-03 ENCOUNTER — Other Ambulatory Visit (HOSPITAL_COMMUNITY): Payer: Self-pay

## 2023-08-06 ENCOUNTER — Other Ambulatory Visit (HOSPITAL_COMMUNITY): Payer: Self-pay

## 2023-08-11 DIAGNOSIS — F339 Major depressive disorder, recurrent, unspecified: Secondary | ICD-10-CM | POA: Diagnosis not present

## 2023-08-18 DIAGNOSIS — F339 Major depressive disorder, recurrent, unspecified: Secondary | ICD-10-CM | POA: Diagnosis not present

## 2023-08-25 ENCOUNTER — Other Ambulatory Visit (HOSPITAL_COMMUNITY): Payer: Self-pay

## 2023-08-25 ENCOUNTER — Other Ambulatory Visit: Payer: Self-pay

## 2023-08-26 ENCOUNTER — Other Ambulatory Visit (HOSPITAL_COMMUNITY): Payer: Self-pay

## 2023-09-08 DIAGNOSIS — F338 Other recurrent depressive disorders: Secondary | ICD-10-CM | POA: Diagnosis not present

## 2023-09-08 DIAGNOSIS — F339 Major depressive disorder, recurrent, unspecified: Secondary | ICD-10-CM | POA: Diagnosis not present

## 2023-09-14 ENCOUNTER — Other Ambulatory Visit (INDEPENDENT_AMBULATORY_CARE_PROVIDER_SITE_OTHER): Payer: Medicare HMO

## 2023-09-14 DIAGNOSIS — E538 Deficiency of other specified B group vitamins: Secondary | ICD-10-CM | POA: Diagnosis not present

## 2023-09-14 DIAGNOSIS — I471 Supraventricular tachycardia, unspecified: Secondary | ICD-10-CM

## 2023-09-14 DIAGNOSIS — L814 Other melanin hyperpigmentation: Secondary | ICD-10-CM | POA: Diagnosis not present

## 2023-09-14 DIAGNOSIS — D225 Melanocytic nevi of trunk: Secondary | ICD-10-CM | POA: Diagnosis not present

## 2023-09-14 DIAGNOSIS — D2271 Melanocytic nevi of right lower limb, including hip: Secondary | ICD-10-CM | POA: Diagnosis not present

## 2023-09-14 DIAGNOSIS — E890 Postprocedural hypothyroidism: Secondary | ICD-10-CM

## 2023-09-14 DIAGNOSIS — L821 Other seborrheic keratosis: Secondary | ICD-10-CM | POA: Diagnosis not present

## 2023-09-14 DIAGNOSIS — L820 Inflamed seborrheic keratosis: Secondary | ICD-10-CM | POA: Diagnosis not present

## 2023-09-14 DIAGNOSIS — E782 Mixed hyperlipidemia: Secondary | ICD-10-CM | POA: Diagnosis not present

## 2023-09-14 DIAGNOSIS — D2239 Melanocytic nevi of other parts of face: Secondary | ICD-10-CM | POA: Diagnosis not present

## 2023-09-14 LAB — LIPID PANEL
Cholesterol: 156 mg/dL (ref 0–200)
HDL: 50.8 mg/dL
LDL Cholesterol: 63 mg/dL (ref 0–99)
NonHDL: 105.69
Total CHOL/HDL Ratio: 3
Triglycerides: 215 mg/dL — ABNORMAL HIGH (ref 0.0–149.0)
VLDL: 43 mg/dL — ABNORMAL HIGH (ref 0.0–40.0)

## 2023-09-14 LAB — BASIC METABOLIC PANEL
BUN: 18 mg/dL (ref 6–23)
CO2: 24 meq/L (ref 19–32)
Calcium: 9 mg/dL (ref 8.4–10.5)
Chloride: 106 meq/L (ref 96–112)
Creatinine, Ser: 0.91 mg/dL (ref 0.40–1.20)
GFR: 63.64 mL/min (ref >60.00)
Glucose, Bld: 116 mg/dL — ABNORMAL HIGH (ref 70–99)
Potassium: 4.1 meq/L (ref 3.5–5.1)
Sodium: 139 meq/L (ref 135–145)

## 2023-09-14 LAB — TSH: TSH: 1.39 u[IU]/mL (ref 0.35–5.50)

## 2023-09-14 LAB — T4, FREE: Free T4: 0.76 ng/dL (ref 0.60–1.60)

## 2023-09-14 LAB — VITAMIN B12: Vitamin B-12: 682 pg/mL (ref 211–911)

## 2023-09-24 ENCOUNTER — Other Ambulatory Visit: Payer: Self-pay | Admitting: Family Medicine

## 2023-09-24 DIAGNOSIS — F419 Anxiety disorder, unspecified: Secondary | ICD-10-CM

## 2023-09-25 ENCOUNTER — Other Ambulatory Visit: Payer: Self-pay

## 2023-09-25 ENCOUNTER — Other Ambulatory Visit (HOSPITAL_COMMUNITY): Payer: Self-pay

## 2023-09-29 ENCOUNTER — Other Ambulatory Visit (HOSPITAL_COMMUNITY): Payer: Self-pay

## 2023-09-29 MED ORDER — FLUOXETINE HCL 20 MG PO CAPS
20.0000 mg | ORAL_CAPSULE | Freq: Every day | ORAL | 1 refills | Status: DC
  Filled 2023-09-29: qty 90, 90d supply, fill #0
  Filled 2024-01-06: qty 90, 90d supply, fill #1

## 2023-09-29 MED ORDER — ALPRAZOLAM 0.5 MG PO TABS
0.2500 mg | ORAL_TABLET | Freq: Every evening | ORAL | 3 refills | Status: DC | PRN
Start: 2023-09-29 — End: 2024-05-19
  Filled 2023-09-29: qty 30, 30d supply, fill #0
  Filled 2023-11-24: qty 30, 30d supply, fill #1
  Filled 2024-01-06: qty 30, 30d supply, fill #2
  Filled 2024-03-01: qty 30, 30d supply, fill #3

## 2023-09-30 ENCOUNTER — Other Ambulatory Visit (HOSPITAL_COMMUNITY): Payer: Self-pay

## 2023-10-08 DIAGNOSIS — F338 Other recurrent depressive disorders: Secondary | ICD-10-CM | POA: Diagnosis not present

## 2023-10-08 DIAGNOSIS — F339 Major depressive disorder, recurrent, unspecified: Secondary | ICD-10-CM | POA: Diagnosis not present

## 2023-10-14 ENCOUNTER — Other Ambulatory Visit: Payer: Self-pay

## 2023-10-14 ENCOUNTER — Other Ambulatory Visit (HOSPITAL_COMMUNITY): Payer: Self-pay

## 2023-10-15 ENCOUNTER — Other Ambulatory Visit (HOSPITAL_COMMUNITY): Payer: Self-pay

## 2023-10-24 DIAGNOSIS — J019 Acute sinusitis, unspecified: Secondary | ICD-10-CM | POA: Diagnosis not present

## 2023-10-24 DIAGNOSIS — R051 Acute cough: Secondary | ICD-10-CM | POA: Diagnosis not present

## 2023-11-24 ENCOUNTER — Other Ambulatory Visit: Payer: Self-pay

## 2023-11-24 ENCOUNTER — Other Ambulatory Visit (HOSPITAL_COMMUNITY): Payer: Self-pay

## 2024-01-05 ENCOUNTER — Telehealth (INDEPENDENT_AMBULATORY_CARE_PROVIDER_SITE_OTHER): Admitting: Family Medicine

## 2024-01-05 ENCOUNTER — Encounter: Payer: Self-pay | Admitting: Family Medicine

## 2024-01-05 ENCOUNTER — Other Ambulatory Visit (HOSPITAL_COMMUNITY): Payer: Self-pay

## 2024-01-05 VITALS — Ht 64.0 in

## 2024-01-05 DIAGNOSIS — F330 Major depressive disorder, recurrent, mild: Secondary | ICD-10-CM | POA: Diagnosis not present

## 2024-01-05 DIAGNOSIS — E538 Deficiency of other specified B group vitamins: Secondary | ICD-10-CM | POA: Diagnosis not present

## 2024-01-05 DIAGNOSIS — R5383 Other fatigue: Secondary | ICD-10-CM | POA: Insufficient documentation

## 2024-01-05 DIAGNOSIS — F419 Anxiety disorder, unspecified: Secondary | ICD-10-CM

## 2024-01-05 DIAGNOSIS — R61 Generalized hyperhidrosis: Secondary | ICD-10-CM | POA: Diagnosis not present

## 2024-01-05 DIAGNOSIS — E890 Postprocedural hypothyroidism: Secondary | ICD-10-CM

## 2024-01-05 MED ORDER — BUPROPION HCL ER (SR) 100 MG PO TB12
100.0000 mg | ORAL_TABLET | Freq: Two times a day (BID) | ORAL | 1 refills | Status: DC
  Filled 2024-01-05: qty 60, 30d supply, fill #0
  Filled 2024-02-01: qty 60, 30d supply, fill #1

## 2024-01-05 NOTE — Assessment & Plan Note (Addendum)
 Exacerbated by the death of her son. We discussed a few options. She agrees with adding Wellbutrin SR 100 mg bid. Continue Fluoxetine 20 mg daily. Continue CBT prn. F/U in 8 weeks, before if needed.

## 2024-01-05 NOTE — Assessment & Plan Note (Signed)
 Associated with depression. Continue Prozac 20 mg daily and alprazolam 0.5 mg daily as needed. Completed 8 weeks if CBT, continue prn.

## 2024-01-05 NOTE — Progress Notes (Signed)
 Virtual Visit via Video Note I connected with [name] on 01/05/2024 by a video enabled telemedicine application and verified that I am speaking with the correct person using two identifiers. Location patient: home Location provider:work office Persons participating in the virtual visit: patient, provider, scribe  I discussed the limitations of evaluation and management by telemedicine and the availability of in person appointments. The patient expressed understanding and agreed to proceed.  Chief Complaint  Patient presents with   Medical Management of Chronic Issues   HPI: Kristin Leonard is a 73 y.o. female with a PMHx significant for HLD, hypothyroidism, anxiety, and vertigo, who is being seen on video for medication follow up.   Last seen on 07/15/2023  Anxiety and depression: Currently on Fluoxetine 20 mg daily and alprazolam 0.5 mg 1/2 to 1 tablet daily at bedtime as needed..  She has taken Zoloft in the past.   She saw a grief counselor for 8 sessions after her son died shortly before her last visit.   -She says she is still having significant fatigue, which she has had for a while and attributes it to depression.    She is sleeping "a lot" but, up to 12 hours, but denies falling asleep suddenly while doing activities like driving or waiting in a waiting room,or watching TV.  No known history of OSA, she has not noted louder snoring.  -She also complains of night sweats and vivid dreams for the last 3 weeks.  Pertinent negatives include fever, chills, abnormal weight loss, changes in bowel habits, moving disorders or jerking, or focal weakness. Recommended cancer screening for her age reported as current.  She would like to have blood work done. B12 deficiency: Currently she is on B12 1000 mcg IM every 5 weeks.  Lab Results  Component Value Date   VITAMINB12 682 09/14/2023   Hypothyroidism: Currently she is on Synthroid 120 mcg daily. Status post  radioablation. Negative for CP, dyspnea, worsening palpitations (history of paroxysmal SVT), abdominal pain, nausea, vomiting, or edema. Lab Results  Component Value Date   TSH 1.39 09/14/2023   ROS: See pertinent positives and negatives per HPI.  Past Medical History:  Diagnosis Date   ASTHMA 04/26/2007   CORONARY ARTERY SPASM 04/26/2007   HYPOTHYROIDISM 02/07/2010   NECK PAIN 02/01/2008   SUPRAVENTRICULAR TACHYCARDIA, HX OF 04/26/2007   Past Surgical History:  Procedure Laterality Date   BREAST SURGERY     biopsy   LASER ABLATION     x2   TUBAL LIGATION     History reviewed. No pertinent family history.  Social History   Socioeconomic History   Marital status: Married    Spouse name: Not on file   Number of children: Not on file   Years of education: Not on file   Highest education level: Not on file  Occupational History   Not on file  Tobacco Use   Smoking status: Never   Smokeless tobacco: Never  Substance and Sexual Activity   Alcohol use: Yes    Alcohol/week: 3.0 standard drinks of alcohol    Types: 3 Glasses of wine per week   Drug use: No   Sexual activity: Not on file  Other Topics Concern   Not on file  Social History Narrative   Not on file   Social Drivers of Health   Financial Resource Strain: Low Risk  (09/19/2022)   Overall Financial Resource Strain (CARDIA)    Difficulty of Paying Living Expenses: Not hard at  all  Food Insecurity: No Food Insecurity (09/19/2022)   Hunger Vital Sign    Worried About Running Out of Food in the Last Year: Never true    Ran Out of Food in the Last Year: Never true  Transportation Needs: No Transportation Needs (09/19/2022)   PRAPARE - Administrator, Civil Service (Medical): No    Lack of Transportation (Non-Medical): No  Physical Activity: Insufficiently Active (09/19/2022)   Exercise Vital Sign    Days of Exercise per Week: 3 days    Minutes of Exercise per Session: 40 min  Stress: No Stress  Concern Present (09/19/2022)   [employer] of Occupational Health - Occupational Stress Questionnaire    Feeling of Stress : Not at all  Social Connections: Socially Integrated (09/19/2022)   Social Connection and Isolation Panel [NHANES]    Frequency of Communication with Friends and Family: More than three times a week    Frequency of Social Gatherings with Friends and Family: More than three times a week    Attends Religious Services: More than 4 times per year    Active Member of [employer] or Organizations: Yes    Attends Engineer, structural: More than 4 times per year    Marital Status: Married  Catering manager Violence: Not At Risk (09/19/2022)   Humiliation, Afraid, Rape, and Kick questionnaire    Fear of Current or Ex-Partner: No    Emotionally Abused: No    Physically Abused: No    Sexually Abused: No     Current Outpatient Medications:    albuterol (VENTOLIN HFA) 108 (90 Base) MCG/ACT inhaler, Inhale 2 puffs into the lungs every 6 (six) hours as needed for wheezing., Disp: 6.7 g, Rfl: 4   ALPRAZolam (XANAX) 0.5 MG tablet, Take 1 tablet (0.5 mg total) by mouth daily as needed., Disp: 30 tablet, Rfl: 1   ALPRAZolam (XANAX) 0.5 MG tablet, Take 0.5-1 tablets (0.25-0.5 mg total) by mouth at bedtime as needed for anxiety., Disp: 30 tablet, Rfl: 3   augmented betamethasone dipropionate (DIPROLENE-AF) 0.05 % cream, Apply a small amount to affected area of hands twice a day for 1-2 weeks as directed, Disp: 50 g, Rfl: 1   budesonide-formoterol (SYMBICORT) 80-4.5 MCG/ACT inhaler, Inhale 2 puffs into the lungs up to 12 times a day as needed as directed (30 days), Disp: 10.2 g, Rfl: 3   buPROPion ER (WELLBUTRIN SR) 100 MG 12 hr tablet, Take 1 tablet (100 mg total) by mouth 2 (two) times daily., Disp: 60 tablet, Rfl: 1   cyanocobalamin (DODEX) 1000 MCG/ML injection, Inject 1 ml intramuscularly every 5 weeks., Disp: 3 mL, Rfl: 2   diltiazem (CARDIZEM CD) 180 MG 24 hr capsule, Take  1 capsule (180 mg total) by mouth daily., Disp: 90 capsule, Rfl: 2   fluorometholone (FML) 0.1 % ophthalmic suspension, Place 1 drop into both eyes twice a day, Disp: 5 mL, Rfl: 0   FLUoxetine (PROZAC) 20 MG capsule, Take 1 capsule (20 mg total) by mouth daily., Disp: 90 capsule, Rfl: 1   fluticasone (FLONASE) 50 MCG/ACT nasal spray, Use 1-2 sprays in each nostril once a day or as needed, Disp: 16 g, Rfl: 5   hydrocortisone 2.5 % cream, Apply a small amount to affected area twice a day for 1-2 weeks(eyelids and face)., Disp: 30 g, Rfl: 1   icosapent Ethyl (VASCEPA) 1 g capsule, Take 1 capsule by mouth 2 times daily., Disp: 180 capsule, Rfl: 1   meclizine (ANTIVERT) 25  MG tablet, Take 1 tablet (25 mg total) by mouth 2 (two) times daily as needed for dizziness., Disp: 30 tablet, Rfl: 1   propranolol (INDERAL) 40 MG tablet, TAKE 1 TABLET BY MOUTH 3 TIMES DAILY AS NEEDED, Disp: 90 tablet, Rfl: 3   simvastatin (ZOCOR) 20 MG tablet, Take 1 tablet (20 mg total) by mouth at bedtime., Disp: 90 tablet, Rfl: 1   sodium fluoride (PREVIDENT 5000 DRY MOUTH) 1.1 % GEL dental gel, Use to brush teeth 2 times daily at 12 noon and 4 pm., Disp: 100 mL, Rfl: 5   SYNTHROID 100 MCG tablet, Take 1 tablet (100 mcg total) by mouth daily before breakfast., Disp: 90 tablet, Rfl: 1   triamcinolone cream (KENALOG) 0.1 %, Apply 1 gram to affected area twice a day, Disp: 80 g, Rfl: 1  EXAM:  VITALS per patient if applicable:Ht 5\' 4"  (1.626 m)   BMI 34.91 kg/m   GENERAL: alert, oriented, appears well and in no acute distress  HEENT: atraumatic, conjunctiva clear, no obvious abnormalities on inspection of external nose and ears  NECK: normal movements of the head and neck  LUNGS: on inspection no signs of respiratory distress, breathing rate appears normal, no obvious gross SOB, gasping or wheezing  CV: no obvious cyanosis  MS: moves all visible extremities without noticeable abnormality  PSYCH/NEURO: pleasant and  cooperative,mild depressed mood, no significant anxiety. Speech and thought processing grossly intact  ASSESSMENT AND PLAN:  Discussed the following assessment and plan:  Fatigue, unspecified type Assessment & Plan: We discussed possible etiologies: Systemic illness, immunologic,endocrinology,sleep disorder, psychiatric/psychologic, infectious,medications side effects, and idiopathic. Sleep study can be consider if not improvement noted after adjusting depression treatment today.  Further recommendations will be given according to lab results.  Orders: -     Comprehensive metabolic panel; Future -     CBC with Differential/Platelet; Future  B12 deficiency Assessment & Plan: Currently on B12 at 1000 mcg IM q. 5 weeks. Further recommendations will be given according to B12 result.  Orders: -     Vitamin B12; Future  Postablative hypothyroidism Assessment & Plan: Last TSH was 1.3 in 09/2023. Coming tomorrow for TSH. Continue Synthroid at 100 mcg daily.  Orders: -     TSH; Future  Night sweat We discussed possible etiologies,?  Hormonal. Monitor for new symptoms. Further recommendation will be given according to lab results. -     Comprehensive metabolic panel; Future -     CBC with Differential/Platelet; Future  Depression, major, recurrent, mild (HCC) Assessment & Plan: Exacerbated by the death of her son. We discussed a few options. She agrees with adding Wellbutrin SR 100 mg bid. Continue Fluoxetine 20 mg daily. Continue CBT prn. F/U in 8 weeks, before if needed.  Orders: -     buPROPion HCl ER (SR); Take 1 tablet (100 mg total) by mouth 2 (two) times daily.  Dispense: 60 tablet; Refill: 1  Anxiety disorder, unspecified type Assessment & Plan: Associated with depression. Continue Prozac 20 mg daily and alprazolam 0.5 mg daily as needed. Completed 8 weeks if CBT, continue prn.  We discussed possible serious and likely etiologies, options for evaluation and  workup, limitations of telemedicine visit vs in person visit, treatment, treatment risks and precautions. The patient was advised to call back or seek an in-person evaluation if the symptoms worsen or if the condition fails to improve as anticipated. I discussed the assessment and treatment plan with the patient. The patient was provided an opportunity to  ask questions and all were answered. The patient agreed with the plan and demonstrated an understanding of the instructions.  Return in about 8 weeks (around 03/01/2024) for chronic problems. She is coming tomorrow for blood work.  I, Rolla Etienne Wierda, acting as a scribe for Nicholi Ghuman [place], MD., have documented all relevant documentation on the behalf of Jarett Dralle [place], MD, as directed by  Mikella Linsley [place], MD while in the presence of Danique Hartsough [place], MD.   I, Sharonica Kraszewski [place], MD, have reviewed all documentation for this visit. The documentation on 01/05/24 for the exam, diagnosis, procedures, and orders are all accurate and complete.  Wirt Hemmerich [place], MD

## 2024-01-05 NOTE — Assessment & Plan Note (Signed)
 Last TSH was 1.3 in 09/2023. Coming tomorrow for TSH. Continue Synthroid at 100 mcg daily.

## 2024-01-05 NOTE — Assessment & Plan Note (Signed)
 We discussed possible etiologies: Systemic illness, immunologic,endocrinology,sleep disorder, psychiatric/psychologic, infectious,medications side effects, and idiopathic. Sleep study can be consider if not improvement noted after adjusting depression treatment today.  Further recommendations will be given according to lab results.

## 2024-01-05 NOTE — Assessment & Plan Note (Signed)
 Currently on B12 at 1000 mcg IM q. 5 weeks. Further recommendations will be given according to B12 result.

## 2024-01-06 ENCOUNTER — Other Ambulatory Visit: Payer: Self-pay

## 2024-01-06 ENCOUNTER — Other Ambulatory Visit (INDEPENDENT_AMBULATORY_CARE_PROVIDER_SITE_OTHER)

## 2024-01-06 ENCOUNTER — Other Ambulatory Visit: Payer: Self-pay | Admitting: Family Medicine

## 2024-01-06 ENCOUNTER — Other Ambulatory Visit (HOSPITAL_COMMUNITY): Payer: Self-pay

## 2024-01-06 DIAGNOSIS — E538 Deficiency of other specified B group vitamins: Secondary | ICD-10-CM

## 2024-01-06 DIAGNOSIS — E890 Postprocedural hypothyroidism: Secondary | ICD-10-CM | POA: Diagnosis not present

## 2024-01-06 DIAGNOSIS — E782 Mixed hyperlipidemia: Secondary | ICD-10-CM

## 2024-01-06 DIAGNOSIS — R61 Generalized hyperhidrosis: Secondary | ICD-10-CM

## 2024-01-06 DIAGNOSIS — R5383 Other fatigue: Secondary | ICD-10-CM | POA: Diagnosis not present

## 2024-01-06 LAB — CBC WITH DIFFERENTIAL/PLATELET
Basophils Absolute: 0.1 10*3/uL (ref 0.0–0.1)
Basophils Relative: 1.1 % (ref 0.0–3.0)
Eosinophils Absolute: 0.5 10*3/uL (ref 0.0–0.7)
Eosinophils Relative: 6.2 % — ABNORMAL HIGH (ref 0.0–5.0)
HCT: 40.5 % (ref 36.0–46.0)
Hemoglobin: 13.6 g/dL (ref 12.0–15.0)
Lymphocytes Relative: 26.4 % (ref 12.0–46.0)
Lymphs Abs: 1.9 10*3/uL (ref 0.7–4.0)
MCHC: 33.6 g/dL (ref 30.0–36.0)
MCV: 93.8 fl (ref 78.0–100.0)
Monocytes Absolute: 0.8 10*3/uL (ref 0.1–1.0)
Monocytes Relative: 10.3 % (ref 3.0–12.0)
Neutro Abs: 4.1 10*3/uL (ref 1.4–7.7)
Neutrophils Relative %: 56 % (ref 43.0–77.0)
Platelets: 236 10*3/uL (ref 150.0–400.0)
RBC: 4.32 Mil/uL (ref 3.87–5.11)
RDW: 13.3 % (ref 11.5–15.5)
WBC: 7.3 10*3/uL (ref 4.0–10.5)

## 2024-01-06 LAB — COMPREHENSIVE METABOLIC PANEL
ALT: 34 U/L (ref 0–35)
AST: 40 U/L — ABNORMAL HIGH (ref 0–37)
Albumin: 4.3 g/dL (ref 3.5–5.2)
Alkaline Phosphatase: 90 U/L (ref 39–117)
BUN: 17 mg/dL (ref 6–23)
CO2: 27 meq/L (ref 19–32)
Calcium: 9.2 mg/dL (ref 8.4–10.5)
Chloride: 105 meq/L (ref 96–112)
Creatinine, Ser: 0.85 mg/dL (ref 0.40–1.20)
GFR: 68.92 mL/min (ref >60.00)
Glucose, Bld: 113 mg/dL — ABNORMAL HIGH (ref 70–99)
Potassium: 4.1 meq/L (ref 3.5–5.1)
Sodium: 139 meq/L (ref 135–145)
Total Bilirubin: 0.9 mg/dL (ref 0.2–1.2)
Total Protein: 7.2 g/dL (ref 6.0–8.3)

## 2024-01-06 LAB — TSH: TSH: 2.09 u[IU]/mL (ref 0.35–5.50)

## 2024-01-06 LAB — VITAMIN B12: Vitamin B-12: 635 pg/mL (ref 211–911)

## 2024-01-06 MED ORDER — CYANOCOBALAMIN 1000 MCG/ML IJ SOLN
INTRAMUSCULAR | 6 refills | Status: AC
  Filled 2024-01-06: qty 2, 70d supply, fill #0
  Filled 2024-02-25: qty 2, 70d supply, fill #1
  Filled 2024-04-12 – 2024-04-21 (×2): qty 2, 70d supply, fill #2
  Filled 2024-07-13: qty 2, 70d supply, fill #3
  Filled 2024-09-12: qty 2, 70d supply, fill #4
  Filled 2024-11-22: qty 2, 70d supply, fill #5

## 2024-01-06 MED ORDER — SIMVASTATIN 20 MG PO TABS
20.0000 mg | ORAL_TABLET | Freq: Every day | ORAL | 2 refills | Status: DC
  Filled 2024-01-06: qty 90, 90d supply, fill #0
  Filled 2024-04-12: qty 90, 90d supply, fill #1
  Filled 2024-07-13: qty 90, 90d supply, fill #2

## 2024-01-11 ENCOUNTER — Encounter: Payer: Self-pay | Admitting: Family Medicine

## 2024-01-12 ENCOUNTER — Encounter: Payer: Self-pay | Admitting: Family Medicine

## 2024-02-16 ENCOUNTER — Ambulatory Visit (INDEPENDENT_AMBULATORY_CARE_PROVIDER_SITE_OTHER): Payer: Medicare HMO | Admitting: Family Medicine

## 2024-02-16 DIAGNOSIS — Z1211 Encounter for screening for malignant neoplasm of colon: Secondary | ICD-10-CM

## 2024-02-16 DIAGNOSIS — Z0000 Encounter for general adult medical examination without abnormal findings: Secondary | ICD-10-CM | POA: Diagnosis not present

## 2024-02-16 NOTE — Progress Notes (Signed)
 PATIENT CHECK-IN and HEALTH RISK ASSESSMENT QUESTIONNAIRE:  -completed by phone/video for upcoming Medicare Preventive Visit  -PLEASE SELECT "NOT IN PERSON" for the method of visit.   Pre-Visit Check-in: 1)Vitals (height, wt, BP, etc) - record in vitals section for visit on day of visit Request home vitals (wt, BP, etc.) and enter into vitals, THEN update Vital Signs SmartPhrase below at the top of the HPI. See below.  2)Review and Update Medications, Allergies PMH, Surgeries, Social history in Epic 3)Hospitalizations in the last year with date/reason? n  4)Review and Update Care Team (patient's specialists) in Epic 5) Complete PHQ9 in Epic  6) Complete Fall Screening in Epic 7)Review all Health Maintenance Due and order under PCP if not done.  Medicare Wellness Patient Questionnaire:  Answer theses question about your habits: How often do you have a drink containing alcohol? Varies, 3x per week How many drinks containing alcohol do you have on a typical day when you are drinking? 1 How often do you have six or more drinks on one occasion? never Have you ever smoked? n Do you use an illicit drugs?n On average, how many days per week do you engage in moderate to strenuous exercise (like a brisk walk)?not recently, was going 3 times per week - is member of a fitness center Typical diet: usually eats two meals a day, feels eats healthy, veggies, fruit, protein  Beverages: water, diet coke  Answer theses question about your everyday activities: Can you perform most household chores? y Are you deaf or have significant trouble hearing?n Do you feel that you have a problem with memory?n Do you feel safe at home?y Last dentist visit? Sees q 3 months 8. Do you have any difficulty performing your everyday activities?n Are you having any difficulty walking, taking medications on your own, and or difficulty managing daily home needs?n Do you have difficulty walking or climbing stairs?n Do  you have difficulty dressing or bathing?n Do you have difficulty doing errands alone such as visiting a doctor's office or shopping?n Do you currently have any difficulty preparing food and eating?n Do you currently have any difficulty using the toilet?n Do you have any difficulty managing your finances?n Do you have any difficulties with housekeeping of managing your housekeeping?n   Do you have Advanced Directives in place (Living Will, Healthcare Power or Attorney)? n   Last eye Exam and location? Goes to eye doctor yearly   Do you currently use prescribed or non-prescribed narcotic or opioid pain medications? n  Do you have a history or close family history of breast, ovarian, tubal or peritoneal cancer or a family member with BRCA (breast cancer susceptibility 1 and 2) gene mutations? n     ----------------------------------------------------------------------------------------------------------------------------------------------------------------------------------------------------------------------  Because this visit was a virtual/telehealth visit, some criteria may be missing or patient reported. Any vitals not documented were not able to be obtained and vitals that have been documented are patient reported.    MEDICARE ANNUAL PREVENTIVE VISIT WITH PROVIDER: (Welcome to Medicare, initial annual wellness or annual wellness exam)  Virtual Visit via Phone Note  I connected with Kristin Leonard on 02/16/24 by a video enabled telemedicine application and verified that I am speaking with the correct person using two identifiers. Video stopped working and she preferred to complete the video in audio - the majority of the visit was in audio only.   Location patient: home Location provider:work or home office Persons participating in the virtual visit: patient, provider  Concerns and/or follow up today:no  new concerns. Recent addition of Wellbutrin. Lost her son last year.  Doing counseling.   See HM section in Epic for other details of completed HM.    ROS: negative for report of fevers, unintentional weight loss, vision changes, vision loss, hearing loss or change, chest pain, sob, hemoptysis, melena, hematochezia, hematuria, falls, bleeding or bruising, thoughts of suicide or self harm, memory loss  Patient-completed extensive health risk assessment - reviewed and discussed with the patient: See Health Risk Assessment completed with patient prior to the visit either above or in recent phone note. This was reviewed in detailed with the patient today and appropriate recommendations, orders and referrals were placed as needed per Summary below and patient instructions.   Review of Medical History: -PMH, PSH, Family History and current specialty and care providers reviewed and updated and listed below   Patient Care Team: [place], Betty G, MD as PCP - General (Family Medicine) Ashby Lawman, MD as Consulting Physician (Obstetrics and Gynecology)   Past Medical History:  Diagnosis Date   ASTHMA 04/26/2007   CORONARY ARTERY SPASM 04/26/2007   HYPOTHYROIDISM 02/07/2010   NECK PAIN 02/01/2008   SUPRAVENTRICULAR TACHYCARDIA, HX OF 04/26/2007    Past Surgical History:  Procedure Laterality Date   BREAST SURGERY     biopsy   LASER ABLATION     x2   TUBAL LIGATION      Social History   Socioeconomic History   Marital status: Married    Spouse name: Not on file   Number of children: Not on file   Years of education: Not on file   Highest education level: Not on file  Occupational History   Not on file  Tobacco Use   Smoking status: Never   Smokeless tobacco: Never  Substance and Sexual Activity   Alcohol use: Yes    Alcohol/week: 3.0 standard drinks of alcohol    Types: 3 Glasses of wine per week   Drug use: No   Sexual activity: Not on file  Other Topics Concern   Not on file  Social History Narrative   Not on file   Social Drivers of  Health   Financial Resource Strain: Low Risk  (09/19/2022)   Overall Financial Resource Strain (CARDIA)    Difficulty of Paying Living Expenses: Not hard at all  Food Insecurity: No Food Insecurity (09/19/2022)   Hunger Vital Sign    Worried About Running Out of Food in the Last Year: Never true    Ran Out of Food in the Last Year: Never true  Transportation Needs: No Transportation Needs (09/19/2022)   PRAPARE - Administrator, Civil Service (Medical): No    Lack of Transportation (Non-Medical): No  Physical Activity: Insufficiently Active (09/19/2022)   Exercise Vital Sign    Days of Exercise per Week: 3 days    Minutes of Exercise per Session: 40 min  Stress: No Stress Concern Present (09/19/2022)   [employer] of Occupational Health - Occupational Stress Questionnaire    Feeling of Stress : Not at all  Social Connections: Socially Integrated (09/19/2022)   Social Connection and Isolation Panel [NHANES]    Frequency of Communication with Friends and Family: More than three times a week    Frequency of Social Gatherings with Friends and Family: More than three times a week    Attends Religious Services: More than 4 times per year    Active Member of [employer] or Organizations: Yes    Attends Banker  Meetings: More than 4 times per year    Marital Status: Married  Catering manager Violence: Not At Risk (09/19/2022)   Humiliation, Afraid, Rape, and Kick questionnaire    Fear of Current or Ex-Partner: No    Emotionally Abused: No    Physically Abused: No    Sexually Abused: No    No family history on file.  Current Outpatient Medications on File Prior to Visit  Medication Sig Dispense Refill   albuterol (VENTOLIN HFA) 108 (90 Base) MCG/ACT inhaler Inhale 2 puffs into the lungs every 6 (six) hours as needed for wheezing. 6.7 g 4   ALPRAZolam (XANAX) 0.5 MG tablet Take 1 tablet (0.5 mg total) by mouth daily as needed. 30 tablet 1   ALPRAZolam (XANAX)  0.5 MG tablet Take 0.5-1 tablets (0.25-0.5 mg total) by mouth at bedtime as needed for anxiety. 30 tablet 3   augmented betamethasone dipropionate (DIPROLENE-AF) 0.05 % cream Apply a small amount to affected area of hands twice a day for 1-2 weeks as directed 50 g 1   budesonide-formoterol (SYMBICORT) 80-4.5 MCG/ACT inhaler Inhale 2 puffs into the lungs up to 12 times a day as needed as directed (30 days) 10.2 g 3   buPROPion ER (WELLBUTRIN SR) 100 MG 12 hr tablet Take 1 tablet (100 mg total) by mouth 2 (two) times daily. 60 tablet 1   cyanocobalamin (DODEX) 1000 MCG/ML injection Inject 1 ml into the muscle every 5 weeks. 3 mL 6   diltiazem (CARDIZEM CD) 180 MG 24 hr capsule Take 1 capsule (180 mg total) by mouth daily. 90 capsule 2   fluorometholone (FML) 0.1 % ophthalmic suspension Place 1 drop into both eyes twice a day 5 mL 0   FLUoxetine (PROZAC) 20 MG capsule Take 1 capsule (20 mg total) by mouth daily. 90 capsule 1   fluticasone (FLONASE) 50 MCG/ACT nasal spray Use 1-2 sprays in each nostril once a day or as needed 16 g 5   hydrocortisone 2.5 % cream Apply a small amount to affected area twice a day for 1-2 weeks(eyelids and face). 30 g 1   icosapent Ethyl (VASCEPA) 1 g capsule Take 1 capsule by mouth 2 times daily. 180 capsule 1   meclizine (ANTIVERT) 25 MG tablet Take 1 tablet (25 mg total) by mouth 2 (two) times daily as needed for dizziness. 30 tablet 1   propranolol (INDERAL) 40 MG tablet TAKE 1 TABLET BY MOUTH 3 TIMES DAILY AS NEEDED 90 tablet 3   simvastatin (ZOCOR) 20 MG tablet Take 1 tablet (20 mg total) by mouth at bedtime. 90 tablet 2   sodium fluoride (PREVIDENT 5000 DRY MOUTH) 1.1 % GEL dental gel Use to brush teeth 2 times daily at 12 noon and 4 pm. 100 mL 5   SYNTHROID 100 MCG tablet Take 1 tablet (100 mcg total) by mouth daily before breakfast. 90 tablet 1   triamcinolone cream (KENALOG) 0.1 % Apply 1 gram to affected area twice a day 80 g 1   No current facility-administered  medications on file prior to visit.    Allergies  Allergen Reactions   No Known Allergies        Physical Exam Vitals requested from patient and listed below if patient had equipment and was able to obtain at home for this virtual visit: There were no vitals filed for this visit. Estimated body mass index is 34.91 kg/m as calculated from the following:   Height as of 01/05/24: 5\' 4"  (1.626 m).  Weight as of 07/15/23: 203 lb 6 oz (92.3 kg).  EKG (optional): deferred due to virtual visit  GENERAL: alert, oriented, no acute distress detected, full vision exam deferred due to pandemic and/or virtual encounter  PSYCH/NEURO: pleasant and cooperative, no obvious depression or anxiety, speech and thought processing grossly intact, Cognitive function grossly intact  Flowsheet Row Clinical Support from 09/19/2022 in [facility]  PHQ-9 Total Score 0           02/16/2024   10:07 AM 09/19/2022    9:05 AM 09/02/2022    3:17 PM 12/11/2021    1:55 PM 06/25/2021    3:05 PM  Depression screen PHQ 2/9  Decreased Interest 0 0 0 0 0  Down, Depressed, Hopeless 1 0 0 1 0  PHQ - 2 Score 1 0 0 1 0  Altered sleeping  0 1 1   Tired, decreased energy  0 2 1   Change in appetite  0 0 0   Feeling bad or failure about yourself   0 0 1   Trouble concentrating  0 0 0   Moving slowly or fidgety/restless  0 0 0   Suicidal thoughts  0 0 0   PHQ-9 Score  0 3 4   Difficult doing work/chores  Not difficult at all Somewhat difficult Not difficult at all        12/11/2021    9:19 AM 12/11/2021    1:56 PM 09/19/2022    9:06 AM 07/15/2023   10:00 AM 02/16/2024   10:07 AM  Fall Risk  Falls in the past year? 0 0 0 0 0  Was there an injury with Fall?  0 0 0 0  Fall Risk Category Calculator  0 0 0 0  Fall Risk Category (Retired)  Low Low    (RETIRED) Patient Fall Risk Level  Low fall risk Low fall risk    Patient at Risk for Falls Due to   No Fall Risks Other (Comment)   Fall  risk Follow up   Falls prevention discussed Falls evaluation completed      SUMMARY AND PLAN:  Encounter for Medicare annual wellness exam  Colon cancer screening - Plan: Cologuard  Discussed applicable health maintenance/preventive health measures and advised and referred or ordered per patient preferences: -she reports she gets a mammogram every year and bone density q 2 years with Dr. Zelphia Cairo, can see orders in system but no report, requested copies for PCP -she prefers to do cologuard for colon cancer screening - ordered, advised to call in 2 weeks if has not received kit -discussed recs/risks for vaccines due, she plans to do at pharmacy, requested that she provide [place] of proof of receipt when she does so that we can update her records Health Maintenance  Topic Date Due   Zoster Vaccines- Shingrix (1 of 2) Never done   Pneumonia Vaccine 48+ Years old (2 of 2 - PCV) 09/15/2020   MAMMOGRAM  01/18/2021   Fecal DNA (Cologuard)  11/13/2022   COVID-19 Vaccine (6 - 2024-25 season) 07/05/2023   INFLUENZA VACCINE  06/03/2024   Medicare Annual Wellness (AWV)  02/15/2025   DTaP/Tdap/Td (2 - Td or Tdap) 12/02/2025   DEXA SCAN  Completed   Hepatitis C Screening  Completed   HPV VACCINES  Aged Out   Meningococcal B Vaccine  Aged Out   Colonoscopy  Discontinued      Education and counseling on the following was provided based  on the above review of health and a plan/checklist for the patient, along with additional information discussed, was provided for the patient in the patient instructions :  -Advised on importance of completing advanced directives, discussed options for completing and provided information in patient instructions as well -Provided counseling and plan for difficulty hearing  -Reviewed patient's current diet. Advised and counseled on a whole foods based healthy diet. A summary of a healthy diet was provided in the Patient Instructions.  -reviewed patient's current  physical activity level and discussed exercise guidelines for adults. Discussed community resources and ideas for safe exercise at home to assist in meeting exercise guideline recommendations in a safe and healthy way.  -Advise yearly dental visits at minimum and regular eye exams -Advised and counseled on alcohol safe limits, risks Follow up: see patient instructions     Patient Instructions  I really enjoyed getting to talk with you today! I am available on Tuesdays and Thursdays for virtual visits if you have any questions or concerns, or if I can be of any further assistance.   CHECKLIST FROM ANNUAL WELLNESS VISIT:  -Follow up (please call to schedule if not scheduled after visit):   -yearly for annual wellness visit with primary care office  Here is a list of your preventive care/health maintenance measures and the plan for each if any are due:  PLAN For any measures below that may be due:  -we ordered the cologuard test today, please complete and return as soon as you receive it. Please call our office if you have not received the kit in the next 2 weeks -please provide proof of any vaccines you receive/received at the pharmacy  Health Maintenance  Topic Date Due   Zoster Vaccines- Shingrix (1 of 2) Never done   Pneumonia Vaccine 53+ Years old (2 of 2 - PCV) 09/15/2020   MAMMOGRAM  01/18/2021   Fecal DNA (Cologuard)  11/13/2022   COVID-19 Vaccine (6 - 2024-25 season) 07/05/2023   INFLUENZA VACCINE  06/03/2024   Medicare Annual Wellness (AWV)  02/15/2025   DTaP/Tdap/Td (2 - Td or Tdap) 12/02/2025   DEXA SCAN  Completed   Hepatitis C Screening  Completed   HPV VACCINES  Aged Out   Meningococcal B Vaccine  Aged Out   Colonoscopy  Discontinued    -See a dentist at least yearly  -Get your eyes checked and then per your eye specialist's recommendations  -Other issues addressed today:   -I have included below further information regarding a healthy whole foods based diet,  physical activity guidelines for adults, stress management and opportunities for social connections. I hope you find this information useful.   -----------------------------------------------------------------------------------------------------------------------------------------------------------------------------------------------------------------------------------------------------------    NUTRITION: -eat real food: lots of colorful vegetables (half the plate) and fruits -5-7 servings of vegetables and fruits per day (fresh or steamed is best), exp. 2 servings of vegetables with lunch and dinner and 2 servings of fruit per day. Berries and greens such as kale and collards are great choices.  -consume on a regular basis:  fresh fruits, fresh veggies, fish, nuts, seeds, healthy oils (such as olive oil, avocado oil), whole grains (make sure for bread/pasta/crackers/etc., that the first ingredient on label contains the word "whole"), legumes. -can eat small amounts of dairy and lean meat (no larger than the palm of your hand), but avoid processed meats such as ham, bacon, lunch meat, etc. -drink water -try to avoid fast food and pre-packaged foods, processed meat, ultra processed foods/beverages (donuts, candy, etc.) -  most experts advise limiting sodium to < 2300mg  per day, should limit further is any chronic conditions such as high blood pressure, heart disease, diabetes, etc. The American Heart Association advised that < 1500mg  is is ideal -try to avoid foods/beverages that contain any ingredients with names you do not recognize  -try to avoid foods/beverages  with added sugar or sweeteners/sweets  -try to avoid sweet drinks (including diet drinks): soda, juice, Gatorade, sweet tea, power drinks, diet drinks -try to avoid white rice, white bread, pasta (unless whole grain)  EXERCISE GUIDELINES FOR ADULTS: -if you wish to increase your physical activity, do so gradually and with the approval  of your doctor -STOP and seek medical care immediately if you have any chest pain, chest discomfort or trouble breathing when starting or increasing exercise  -move and stretch your body, legs, feet and arms when sitting for long periods -Physical activity guidelines for optimal health in adults: -get at least 150 minutes per week of moderate exercise (can talk, but not sing); this is about 20-30 minutes of sustained activity 5-7 days per week or two 10-15 minute episodes of sustained activity 5-7 days per week -do some muscle building/resistance training/strength training at least 2 days per week  -balance exercises 3+ days per week:   Stand somewhere where you have something sturdy to hold onto if you lose balance    1) lift up on toes, then back down, start with 5x per day and work up to 20x   2) stand and lift one leg straight out to the side so that foot is a few inches of the floor, start with 5x each side and work up to 20x each side   3) stand on one foot, start with 5 seconds each side and work up to 20 seconds on each side  If you need ideas or help with getting more active:  -Silver sneakers https://tools.silversneakers.com  -Walk with a Doc: http://www.duncan-williams.com/  -try to include resistance (weight lifting/strength building) and balance exercises twice per week: or the following link for ideas: http://castillo-powell.com/  BuyDucts.dk  STRESS MANAGEMENT: -can try meditating, or just sitting quietly with deep breathing while intentionally relaxing all parts of your body for 5 minutes daily -if you need further help with stress, anxiety or depression please follow up with your primary doctor or contact the wonderful folks at [facility]: [phone]  SOCIAL CONNECTIONS: -options in [address] if you wish to engage in more social and exercise related  activities:  -Silver sneakers https://tools.silversneakers.com  -Walk with a Doc: http://www.duncan-williams.com/  -Check out the [facility] Active Adults 50+ section on the [place] of [employer] (hiking clubs, book clubs, cards and games, chess, exercise classes, aquatic classes and much more) - see the website for details: https://www.[place].gov/departments/parks-recreation/active-adults50  -YouTube has lots of exercise videos for different ages and abilities as well  -[facility] (a variety of indoor and outdoor inperson activities for adults). [phone]. [address].  -Virtual Online Classes (a variety of topics): see seniorplanet.org or call [phone]  -consider volunteering at a school, hospice center, church, senior center or elsewhere   ADVANCED HEALTHCARE DIRECTIVES:  [place] Advanced Directives assistance:   ExpressWeek.com.cy  Everyone should have advanced health care directives in place. This is so that you get the care you want, should you ever be in a situation where you are unable to make your own medical decisions.   From the [place] Advanced Directive Website: "Advance Health Care Directives are legal documents in which  you give written instructions about your health care if, in the future, you cannot speak for yourself.   A health care power of attorney allows you to name a person you trust to make your health care decisions if you cannot make them yourself. A declaration of a desire for a natural death (or living will) is document, which states that you desire not to have your life prolonged by extraordinary measures if you have a terminal or incurable illness or if you are in a vegetative state. An advance instruction for mental health treatment makes a declaration of instructions, information and preferences regarding your mental health treatment. It also states that you are aware  that the advance instruction authorizes a mental health treatment provider to act according to your wishes. It may also outline your consent or refusal of mental health treatment. A declaration of an anatomical gift allows anyone over the age of 2 to make a gift by will, organ donor card or other document."   Please see the following website or an elder law attorney for forms, FAQs and for completion of advanced directives: [place]  Print production planner Health Care Directives Advance Health Care Directives (http://guzman.[email or web address]/)  Or copy and paste the following to your web browser: [email or web address]           Maurie Southern, DO

## 2024-02-16 NOTE — Patient Instructions (Signed)
 I really enjoyed getting to talk with you today! I am available on Tuesdays and Thursdays for virtual visits if you have any questions or concerns, or if I can be of any further assistance.   CHECKLIST FROM ANNUAL WELLNESS VISIT:  -Follow up (please call to schedule if not scheduled after visit):   -yearly for annual wellness visit with primary care office  Here is a list of your preventive care/health maintenance measures and the plan for each if any are due:  PLAN For any measures below that may be due:  -we ordered the cologuard test today, please complete and return as soon as you receive it. Please call our office if you have not received the kit in the next 2 weeks -please provide proof of any vaccines you receive/received at the pharmacy  Health Maintenance  Topic Date Due   Zoster Vaccines- Shingrix (1 of 2) Never done   Pneumonia Vaccine 28+ Years old (2 of 2 - PCV) 09/15/2020   MAMMOGRAM  01/18/2021   Fecal DNA (Cologuard)  11/13/2022   COVID-19 Vaccine (6 - 2024-25 season) 07/05/2023   INFLUENZA VACCINE  06/03/2024   Medicare Annual Wellness (AWV)  02/15/2025   DTaP/Tdap/Td (2 - Td or Tdap) 12/02/2025   DEXA SCAN  Completed   Hepatitis C Screening  Completed   HPV VACCINES  Aged Out   Meningococcal B Vaccine  Aged Out   Colonoscopy  Discontinued    -See a dentist at least yearly  -Get your eyes checked and then per your eye specialist's recommendations  -Other issues addressed today:   -I have included below further information regarding a healthy whole foods based diet, physical activity guidelines for adults, stress management and opportunities for social connections. I hope you find this information useful.    -----------------------------------------------------------------------------------------------------------------------------------------------------------------------------------------------------------------------------------------------------------    NUTRITION: -eat real food: lots of colorful vegetables (half the plate) and fruits -5-7 servings of vegetables and fruits per day (fresh or steamed is best), exp. 2 servings of vegetables with lunch and dinner and 2 servings of fruit per day. Berries and greens such as kale and collards are great choices.  -consume on a regular basis:  fresh fruits, fresh veggies, fish, nuts, seeds, healthy oils (such as olive oil, avocado oil), whole grains (make sure for bread/pasta/crackers/etc., that the first ingredient on label contains the word "whole"), legumes. -can eat small amounts of dairy and lean meat (no larger than the palm of your hand), but avoid processed meats such as ham, bacon, lunch meat, etc. -drink water -try to avoid fast food and pre-packaged foods, processed meat, ultra processed foods/beverages (donuts, candy, etc.) -most experts advise limiting sodium to < 2300mg  per day, should limit further is any chronic conditions such as high blood pressure, heart disease, diabetes, etc. The American Heart Association advised that < 1500mg  is is ideal -try to avoid foods/beverages that contain any ingredients with names you do not recognize  -try to avoid foods/beverages  with added sugar or sweeteners/sweets  -try to avoid sweet drinks (including diet drinks): soda, juice, Gatorade, sweet tea, power drinks, diet drinks -try to avoid white rice, white bread, pasta (unless whole grain)  EXERCISE GUIDELINES FOR ADULTS: -if you wish to increase your physical activity, do so gradually and with the approval of your doctor -STOP and seek medical care immediately if you have any chest pain, chest discomfort or trouble breathing when starting or  increasing exercise  -move and stretch your body, legs, feet and arms when sitting  for long periods -Physical activity guidelines for optimal health in adults: -get at least 150 minutes per week of moderate exercise (can talk, but not sing); this is about 20-30 minutes of sustained activity 5-7 days per week or two 10-15 minute episodes of sustained activity 5-7 days per week -do some muscle building/resistance training/strength training at least 2 days per week  -balance exercises 3+ days per week:   Stand somewhere where you have something sturdy to hold onto if you lose balance    1) lift up on toes, then back down, start with 5x per day and work up to 20x   2) stand and lift one leg straight out to the side so that foot is a few inches of the floor, start with 5x each side and work up to 20x each side   3) stand on one foot, start with 5 seconds each side and work up to 20 seconds on each side  If you need ideas or help with getting more active:  -Silver sneakers https://tools.silversneakers.com  -Walk with a Doc: http://www.duncan-williams.com/  -try to include resistance (weight lifting/strength building) and balance exercises twice per week: or the following link for ideas: http://castillo-powell.com/  BuyDucts.dk  STRESS MANAGEMENT: -can try meditating, or just sitting quietly with deep breathing while intentionally relaxing all parts of your body for 5 minutes daily -if you need further help with stress, anxiety or depression please follow up with your primary doctor or contact the wonderful folks at [facility]: [phone]  SOCIAL CONNECTIONS: -options in [place] if you wish to engage in more social and exercise related activities:  -Silver sneakers https://tools.silversneakers.com  -Walk with a Doc: http://www.duncan-williams.com/  -Check out the [facility] Active Adults 50+  section on the [place] of [employer] (hiking clubs, book clubs, cards and games, chess, exercise classes, aquatic classes and much more) - see the website for details: https://www.[place].gov/departments/parks-recreation/active-adults50  -YouTube has lots of exercise videos for different ages and abilities as well  -[facility] (a variety of indoor and outdoor inperson activities for adults). [phone]. [address]  -Virtual Online Classes (a variety of topics): see seniorplanet.org or call [phone]  -consider volunteering at a school, hospice center, church, senior center or elsewhere   ADVANCED HEALTHCARE DIRECTIVES:  [place] Advanced Directives assistance:   ExpressWeek.com.cy  Everyone should have advanced health care directives in place. This is so that you get the care you want, should you ever be in a situation where you are unable to make your own medical decisions.   From the [place] Advanced Directive Website: "Advance Health Care Directives are legal documents in which you give written instructions about your health care if, in the future, you cannot speak for yourself.   A health care power of attorney allows you to name a person you trust to make your health care decisions if you cannot make them yourself. A declaration of a desire for a natural death (or living will) is document, which states that you desire not to have your life prolonged by extraordinary measures if you have a terminal or incurable illness or if you are in a vegetative state. An advance instruction for mental health treatment makes a declaration of instructions, information and preferences regarding your mental health treatment. It also states that you are aware that the advance instruction authorizes a mental health treatment provider to act according to your wishes. It may also outline your consent or refusal of mental  health treatment. A declaration of an  anatomical gift allows anyone over the age of 45 to make a gift by will, organ donor card or other document."   Please see the following website or an elder law attorney for forms, FAQs and for completion of advanced directives: [place]  Print production planner Health Care Directives Advance Health Care Directives (http://guzman.com/)  Or copy and paste the following to your web browser: PoshChat.fi

## 2024-02-25 ENCOUNTER — Other Ambulatory Visit: Payer: Self-pay | Admitting: Family Medicine

## 2024-02-25 DIAGNOSIS — E890 Postprocedural hypothyroidism: Secondary | ICD-10-CM

## 2024-02-25 DIAGNOSIS — E782 Mixed hyperlipidemia: Secondary | ICD-10-CM

## 2024-02-26 ENCOUNTER — Other Ambulatory Visit: Payer: Self-pay

## 2024-02-26 ENCOUNTER — Other Ambulatory Visit (HOSPITAL_COMMUNITY): Payer: Self-pay

## 2024-02-26 MED ORDER — SYNTHROID 100 MCG PO TABS
100.0000 ug | ORAL_TABLET | Freq: Every day | ORAL | 1 refills | Status: DC
Start: 2024-02-26 — End: 2024-08-18
  Filled 2024-02-26: qty 90, 90d supply, fill #0
  Filled 2024-05-25: qty 90, 90d supply, fill #1

## 2024-02-26 MED ORDER — ICOSAPENT ETHYL 1 G PO CAPS
1.0000 g | ORAL_CAPSULE | Freq: Two times a day (BID) | ORAL | 1 refills | Status: DC
  Filled 2024-02-26: qty 180, 90d supply, fill #0
  Filled 2024-04-29 – 2024-05-25 (×2): qty 180, 90d supply, fill #1

## 2024-03-01 ENCOUNTER — Other Ambulatory Visit (HOSPITAL_COMMUNITY): Payer: Self-pay

## 2024-03-01 ENCOUNTER — Other Ambulatory Visit: Payer: Self-pay

## 2024-03-01 ENCOUNTER — Other Ambulatory Visit: Payer: Self-pay | Admitting: Family Medicine

## 2024-03-01 DIAGNOSIS — F330 Major depressive disorder, recurrent, mild: Secondary | ICD-10-CM

## 2024-03-01 MED ORDER — BUPROPION HCL ER (SR) 100 MG PO TB12
100.0000 mg | ORAL_TABLET | Freq: Two times a day (BID) | ORAL | 1 refills | Status: DC
  Filled 2024-03-01: qty 60, 30d supply, fill #0

## 2024-03-30 ENCOUNTER — Ambulatory Visit (INDEPENDENT_AMBULATORY_CARE_PROVIDER_SITE_OTHER): Admitting: Family Medicine

## 2024-03-30 ENCOUNTER — Ambulatory Visit

## 2024-03-30 ENCOUNTER — Encounter: Payer: Self-pay | Admitting: Family Medicine

## 2024-03-30 ENCOUNTER — Other Ambulatory Visit (HOSPITAL_COMMUNITY): Payer: Self-pay

## 2024-03-30 VITALS — BP 110/70 | HR 95 | Temp 98.1°F | Resp 16 | Ht 64.0 in | Wt 205.0 lb

## 2024-03-30 DIAGNOSIS — F330 Major depressive disorder, recurrent, mild: Secondary | ICD-10-CM

## 2024-03-30 DIAGNOSIS — F419 Anxiety disorder, unspecified: Secondary | ICD-10-CM | POA: Diagnosis not present

## 2024-03-30 DIAGNOSIS — J4520 Mild intermittent asthma, uncomplicated: Secondary | ICD-10-CM

## 2024-03-30 DIAGNOSIS — R053 Chronic cough: Secondary | ICD-10-CM

## 2024-03-30 MED ORDER — AEROCHAMBER PLUS FLO-VU MISC
0 refills | Status: AC
  Filled 2024-03-30: qty 1, 30d supply, fill #0

## 2024-03-30 MED ORDER — ARIPIPRAZOLE 2 MG PO TABS
2.0000 mg | ORAL_TABLET | Freq: Every day | ORAL | 1 refills | Status: DC
Start: 2024-03-30 — End: 2024-05-19
  Filled 2024-03-30: qty 30, 30d supply, fill #0
  Filled 2024-04-12 – 2024-04-22 (×3): qty 30, 30d supply, fill #1

## 2024-03-30 MED ORDER — BUDESONIDE-FORMOTEROL FUMARATE 80-4.5 MCG/ACT IN AERO
INHALATION_SPRAY | RESPIRATORY_TRACT | 3 refills | Status: AC
  Filled 2024-03-30: qty 10.2, 8d supply, fill #0
  Filled 2024-06-22: qty 10.2, 8d supply, fill #1
  Filled 2024-09-12: qty 10.2, 8d supply, fill #2

## 2024-03-30 NOTE — Patient Instructions (Addendum)
 A few things to remember from today's visit:  Depression, major, recurrent, mild (HCC) - Plan: ARIPiprazole (ABILIFY) 2 MG tablet  Mild intermittent asthma without complication - Plan: Spacer/Aero-Holding Chambers (AEROCHAMBER PLUS) Device, budesonide -formoterol  (SYMBICORT ) 80-4.5 MCG/ACT inhaler  Persistent cough for 3 weeks or longer - Plan: DG Chest 2 View Symbicort  1 puff 4 times per day for 1 week then 2 times daily.  Decrease Wellbutrin  to 1 tab daily for 10 days then every other day for 10 days and stop. Start Abilify 2 mg daily. No changes in rest.  If you need refills for medications you take chronically, please call your pharmacy. Do not use My Chart to request refills or for acute issues that need immediate attention. If you send a my chart message, it may take a few days to be addressed, specially if I am not in the office.  Please be sure medication list is accurate. If a new problem present, please set up appointment sooner than planned today.

## 2024-03-30 NOTE — Progress Notes (Unsigned)
 ACUTE VISIT Chief Complaint  Patient presents with   Cough    Dry & non productive, mainly at night   HPI: [name] is a 73 y.o. female with a PMHx significant for HLD, hypothyroidism, anxiety, and vertigo, who is here today complaining of cough.   Patient complains of a lingering dry cough that first started in 10/2023. She says she was dx'ed with bronchitis at an urgent care in [place]  and given steroids and antibiotics.  After the urgent care visit, the cough improved but has not resolved. Cough is intermittent for a few days at a time over the last couple of months.  The cough is worse at night, and she says 90% of her problems are at night. She states she often wakes up with a choking sensation. These episodes last for about 20 minutes. She reports chest soreness the morning after coughing.  She has a hx of asthma.  Drinking carbonated beverages seems to help the cough, but water or cough syrup do not.  She has not been taking anything OTC for the cough.  Not taking Symbicort  at this time. She usually only takes it in the winter.  Pertinent negatives include fever, change in appetite, unusual SOB or wheezing, heartburn, burping, or unusual palpitations.   Depression/fatigue:  Currently on Wellbutrin  SR 100 mg twice daily and Fluoxetine  20 mg daily.  Since starting the Wellbutrin , she says she has been having problems with vivid dreams.  She also mentions she has been having some recent fatigue, even though she sleeps 9 hours.  No hx of sleep apnea.   Review of Systems  Constitutional:  Negative for activity change and appetite change.  See other pertinent positives and negatives in HPI.  Current Outpatient Medications on File Prior to Visit  Medication Sig Dispense Refill   albuterol  (VENTOLIN  HFA) 108 (90 Base) MCG/ACT inhaler Inhale 2 puffs into the lungs every 6 (six) hours as needed for wheezing. 6.7 g 4   ALPRAZolam  (XANAX ) 0.5 MG tablet Take 0.5-1 tablets  (0.25-0.5 mg total) by mouth at bedtime as needed for anxiety. 30 tablet 3   augmented betamethasone  dipropionate (DIPROLENE -AF) 0.05 % cream Apply a small amount to affected area of hands twice a day for 1-2 weeks as directed 50 g 1   budesonide -formoterol  (SYMBICORT ) 80-4.5 MCG/ACT inhaler Inhale 2 puffs into the lungs up to 12 times a day as needed as directed (30 days) 10.2 g 3   buPROPion  ER (WELLBUTRIN  SR) 100 MG 12 hr tablet Take 1 tablet (100 mg total) by mouth 2 (two) times daily. 60 tablet 1   cyanocobalamin  (DODEX ) 1000 MCG/ML injection Inject 1 ml into the muscle every 5 weeks. 3 mL 6   diltiazem  (CARDIZEM  CD) 180 MG 24 hr capsule Take 1 capsule (180 mg total) by mouth daily. 90 capsule 2   fluorometholone  (FML) 0.1 % ophthalmic suspension Place 1 drop into both eyes twice a day 5 mL 0   FLUoxetine  (PROZAC ) 20 MG capsule Take 1 capsule (20 mg total) by mouth daily. 90 capsule 1   fluticasone  (FLONASE ) 50 MCG/ACT nasal spray Use 1-2 sprays in each nostril once a day or as needed 16 g 5   hydrocortisone  2.5 % cream Apply a small amount to affected area twice a day for 1-2 weeks(eyelids and face). 30 g 1   icosapent  Ethyl (VASCEPA ) 1 g capsule Take 1 capsule by mouth 2 times daily. 180 capsule 1   meclizine  (ANTIVERT ) 25 MG tablet  Take 1 tablet (25 mg total) by mouth 2 (two) times daily as needed for dizziness. 30 tablet 1   propranolol  (INDERAL ) 40 MG tablet TAKE 1 TABLET BY MOUTH 3 TIMES DAILY AS NEEDED 90 tablet 3   simvastatin  (ZOCOR ) 20 MG tablet Take 1 tablet (20 mg total) by mouth at bedtime. 90 tablet 2   sodium fluoride  (PREVIDENT 5000 DRY MOUTH) 1.1 % GEL dental gel Use to brush teeth 2 times daily at 12 noon and 4 pm. 100 mL 5   SYNTHROID  100 MCG tablet Take 1 tablet (100 mcg total) by mouth daily before breakfast. 90 tablet 1   triamcinolone  cream (KENALOG ) 0.1 % Apply 1 gram to affected area twice a day 80 g 1   No current facility-administered medications on file prior to  visit.    Past Medical History:  Diagnosis Date   ASTHMA 04/26/2007   CORONARY ARTERY SPASM 04/26/2007   HYPOTHYROIDISM 02/07/2010   NECK PAIN 02/01/2008   SUPRAVENTRICULAR TACHYCARDIA, HX OF 04/26/2007   Allergies  Allergen Reactions   No Known Allergies     Social History   Socioeconomic History   Marital status: Married    Spouse name: Not on file   Number of children: Not on file   Years of education: Not on file   Highest education level: Not on file  Occupational History   Not on file  Tobacco Use   Smoking status: Never   Smokeless tobacco: Never  Substance and Sexual Activity   Alcohol use: Yes    Alcohol/week: 3.0 standard drinks of alcohol    Types: 3 Glasses of wine per week   Drug use: No   Sexual activity: Not on file  Other Topics Concern   Not on file  Social History Narrative   Not on file   Social Drivers of Health   Financial Resource Strain: Low Risk  (09/19/2022)   Overall Financial Resource Strain (CARDIA)    Difficulty of Paying Living Expenses: Not hard at all  Food Insecurity: No Food Insecurity (09/19/2022)   Hunger Vital Sign    Worried About Running Out of Food in the Last Year: Never true    Ran Out of Food in the Last Year: Never true  Transportation Needs: No Transportation Needs (09/19/2022)   PRAPARE - Administrator, Civil Service (Medical): No    Lack of Transportation (Non-Medical): No  Physical Activity: Insufficiently Active (09/19/2022)   Exercise Vital Sign    Days of Exercise per Week: 3 days    Minutes of Exercise per Session: 40 min  Stress: No Stress Concern Present (09/19/2022)   [employer] of Occupational Health - Occupational Stress Questionnaire    Feeling of Stress : Not at all  Social Connections: Socially Integrated (09/19/2022)   Social Connection and Isolation Panel [NHANES]    Frequency of Communication with Friends and Family: More than three times a week    Frequency of Social Gatherings  with Friends and Family: More than three times a week    Attends Religious Services: More than 4 times per year    Active Member of [employer] or Organizations: Yes    Attends Engineer, structural: More than 4 times per year    Marital Status: Married    There were no vitals filed for this visit. Body mass index is 34.91 kg/m.  Physical Exam Vitals and nursing note reviewed.  Constitutional:      General: She is not in  acute distress.    Appearance: She is well-developed.  HENT:     Head: Normocephalic and atraumatic.     Mouth/Throat:     Mouth: Mucous membranes are moist.     Pharynx: Oropharynx is clear. Uvula midline.  Eyes:     Conjunctiva/sclera: Conjunctivae normal.  Cardiovascular:     Rate and Rhythm: Normal rate and regular rhythm.     Pulses:          Dorsalis pedis pulses are 2+ on the right side and 2+ on the left side.     Heart sounds: No murmur heard. Pulmonary:     Effort: Pulmonary effort is normal. No respiratory distress.     Breath sounds: Normal breath sounds. No decreased air movement. No wheezing.  Abdominal:     Palpations: Abdomen is soft. There is no hepatomegaly or mass.     Tenderness: There is no abdominal tenderness.  Musculoskeletal:     Right lower leg: No edema.     Left lower leg: No edema.  Lymphadenopathy:     Cervical: No cervical adenopathy.  Skin:    General: Skin is warm.     Findings: No erythema or rash.  Neurological:     General: No focal deficit present.     Mental Status: She is alert and oriented to person, place, and time.     Cranial Nerves: No cranial nerve deficit.     Gait: Gait normal.  Psychiatric:     Comments: Well groomed, good eye contact.    ASSESSMENT AND PLAN:  Ms. Bayle was seen today for cough.   There are no diagnoses linked to this encounter.  No follow-ups on file.  I, Fritz Jewel Wierda, acting as a scribe for Henretter Piekarski [place], MD., have documented all relevant documentation on the behalf  of Inmer Nix [place], MD, as directed by  Jaxsen Bernhart [place], MD while in the presence of Inaki Vantine [place], MD.   I, Rinda Rollyson [place], MD, have reviewed all documentation for this visit. The documentation on 03/30/24 for the exam, diagnosis, procedures, and orders are all accurate and complete.  Bryse Blanchette G. [place], MD  [facility]. [place] office.  Discharge Instructions   None

## 2024-03-31 NOTE — Assessment & Plan Note (Signed)
 Wellbutrin  SR causing vivid dreams, so recommend discontinuing medication. We discussed options, including adding Buspar or low dose Abilify , the prefers the latter one. Abilify  2 mg daily added today. Continue Fluoxetine  20 mg daily. F/U in 3 months.

## 2024-03-31 NOTE — Assessment & Plan Note (Signed)
 This problem could be contributing to her persistent cough. Recommend starting Symbicort  160-4.5 mcg 4 times per day for 1 weeks then bid, rinse after use. F/U in 3 months.

## 2024-03-31 NOTE — Assessment & Plan Note (Signed)
 Stable overall. Continue Fluoxetine  20 mg daily and alprazolam  0.5 mg daily as needed.

## 2024-04-05 ENCOUNTER — Encounter: Payer: Self-pay | Admitting: Family Medicine

## 2024-04-08 ENCOUNTER — Ambulatory Visit: Payer: Self-pay | Admitting: Family Medicine

## 2024-04-12 ENCOUNTER — Other Ambulatory Visit (HOSPITAL_COMMUNITY): Payer: Self-pay

## 2024-04-12 ENCOUNTER — Other Ambulatory Visit: Payer: Self-pay | Admitting: Family Medicine

## 2024-04-12 ENCOUNTER — Other Ambulatory Visit: Payer: Self-pay

## 2024-04-12 DIAGNOSIS — I471 Supraventricular tachycardia, unspecified: Secondary | ICD-10-CM

## 2024-04-12 DIAGNOSIS — F419 Anxiety disorder, unspecified: Secondary | ICD-10-CM

## 2024-04-12 MED ORDER — DILTIAZEM HCL ER COATED BEADS 180 MG PO CP24
180.0000 mg | ORAL_CAPSULE | Freq: Every day | ORAL | 2 refills | Status: AC
  Filled 2024-04-12: qty 90, 90d supply, fill #0
  Filled 2024-07-13: qty 90, 90d supply, fill #1
  Filled 2024-10-12: qty 90, 90d supply, fill #2

## 2024-04-12 MED ORDER — FLUOXETINE HCL 20 MG PO CAPS
20.0000 mg | ORAL_CAPSULE | Freq: Every day | ORAL | 2 refills | Status: AC
  Filled 2024-04-12: qty 90, 90d supply, fill #0
  Filled 2024-07-13: qty 90, 90d supply, fill #1
  Filled 2024-10-12: qty 90, 90d supply, fill #2

## 2024-04-13 DIAGNOSIS — Z1211 Encounter for screening for malignant neoplasm of colon: Secondary | ICD-10-CM | POA: Diagnosis not present

## 2024-04-14 ENCOUNTER — Other Ambulatory Visit (HOSPITAL_COMMUNITY): Payer: Self-pay

## 2024-04-15 ENCOUNTER — Other Ambulatory Visit: Payer: Self-pay

## 2024-04-15 ENCOUNTER — Other Ambulatory Visit (HOSPITAL_COMMUNITY): Payer: Self-pay

## 2024-04-18 LAB — COLOGUARD: COLOGUARD: NEGATIVE

## 2024-04-19 ENCOUNTER — Ambulatory Visit: Payer: Self-pay | Admitting: Family Medicine

## 2024-04-21 ENCOUNTER — Other Ambulatory Visit (HOSPITAL_COMMUNITY): Payer: Self-pay

## 2024-04-26 DIAGNOSIS — Z1231 Encounter for screening mammogram for malignant neoplasm of breast: Secondary | ICD-10-CM | POA: Diagnosis not present

## 2024-04-26 DIAGNOSIS — Z6834 Body mass index (BMI) 34.0-34.9, adult: Secondary | ICD-10-CM | POA: Diagnosis not present

## 2024-04-26 DIAGNOSIS — Z01419 Encounter for gynecological examination (general) (routine) without abnormal findings: Secondary | ICD-10-CM | POA: Diagnosis not present

## 2024-04-27 DIAGNOSIS — M8588 Other specified disorders of bone density and structure, other site: Secondary | ICD-10-CM | POA: Diagnosis not present

## 2024-04-27 DIAGNOSIS — N958 Other specified menopausal and perimenopausal disorders: Secondary | ICD-10-CM | POA: Diagnosis not present

## 2024-04-29 ENCOUNTER — Other Ambulatory Visit (HOSPITAL_BASED_OUTPATIENT_CLINIC_OR_DEPARTMENT_OTHER): Payer: Self-pay

## 2024-04-30 ENCOUNTER — Other Ambulatory Visit (HOSPITAL_COMMUNITY): Payer: Self-pay

## 2024-05-11 DIAGNOSIS — M8580 Other specified disorders of bone density and structure, unspecified site: Secondary | ICD-10-CM | POA: Diagnosis not present

## 2024-05-11 DIAGNOSIS — M8588 Other specified disorders of bone density and structure, other site: Secondary | ICD-10-CM | POA: Diagnosis not present

## 2024-05-12 ENCOUNTER — Other Ambulatory Visit (HOSPITAL_COMMUNITY): Payer: Self-pay

## 2024-05-12 MED ORDER — AMOXICILLIN-POT CLAVULANATE 875-125 MG PO TABS
1.0000 | ORAL_TABLET | Freq: Two times a day (BID) | ORAL | 0 refills | Status: AC
  Filled 2024-05-12: qty 14, 7d supply, fill #0

## 2024-05-19 ENCOUNTER — Other Ambulatory Visit: Payer: Self-pay

## 2024-05-19 ENCOUNTER — Other Ambulatory Visit: Payer: Self-pay | Admitting: Family Medicine

## 2024-05-19 DIAGNOSIS — F330 Major depressive disorder, recurrent, mild: Secondary | ICD-10-CM

## 2024-05-19 DIAGNOSIS — F419 Anxiety disorder, unspecified: Secondary | ICD-10-CM

## 2024-05-20 ENCOUNTER — Other Ambulatory Visit (HOSPITAL_COMMUNITY): Payer: Self-pay

## 2024-05-20 ENCOUNTER — Other Ambulatory Visit: Payer: Self-pay

## 2024-05-20 MED ORDER — ARIPIPRAZOLE 2 MG PO TABS
2.0000 mg | ORAL_TABLET | Freq: Every day | ORAL | 3 refills | Status: DC
  Filled 2024-05-20: qty 30, 30d supply, fill #0
  Filled 2024-06-22: qty 30, 30d supply, fill #1
  Filled 2024-07-13 – 2024-07-19 (×2): qty 30, 30d supply, fill #2
  Filled 2024-08-18: qty 30, 30d supply, fill #3

## 2024-05-21 MED ORDER — ALPRAZOLAM 0.5 MG PO TABS
0.2500 mg | ORAL_TABLET | Freq: Every evening | ORAL | 3 refills | Status: AC | PRN
  Filled 2024-05-21: qty 30, 30d supply, fill #0
  Filled 2024-06-22: qty 30, 30d supply, fill #1
  Filled 2024-08-18: qty 30, 30d supply, fill #2
  Filled 2024-10-12: qty 30, 30d supply, fill #3

## 2024-05-23 ENCOUNTER — Other Ambulatory Visit (HOSPITAL_COMMUNITY): Payer: Self-pay

## 2024-05-25 ENCOUNTER — Other Ambulatory Visit: Payer: Self-pay

## 2024-07-11 ENCOUNTER — Ambulatory Visit: Admitting: Family Medicine

## 2024-07-12 ENCOUNTER — Encounter: Payer: Self-pay | Admitting: Family Medicine

## 2024-07-12 ENCOUNTER — Other Ambulatory Visit (HOSPITAL_COMMUNITY): Payer: Self-pay

## 2024-07-12 ENCOUNTER — Ambulatory Visit (INDEPENDENT_AMBULATORY_CARE_PROVIDER_SITE_OTHER): Admitting: Family Medicine

## 2024-07-12 VITALS — BP 110/70 | HR 60 | Temp 97.9°F | Resp 16 | Ht 64.0 in | Wt 211.0 lb

## 2024-07-12 DIAGNOSIS — R600 Localized edema: Secondary | ICD-10-CM | POA: Diagnosis not present

## 2024-07-12 DIAGNOSIS — F330 Major depressive disorder, recurrent, mild: Secondary | ICD-10-CM

## 2024-07-12 DIAGNOSIS — F419 Anxiety disorder, unspecified: Secondary | ICD-10-CM | POA: Diagnosis not present

## 2024-07-12 DIAGNOSIS — J4520 Mild intermittent asthma, uncomplicated: Secondary | ICD-10-CM | POA: Diagnosis not present

## 2024-07-12 DIAGNOSIS — R053 Chronic cough: Secondary | ICD-10-CM

## 2024-07-12 MED ORDER — FUROSEMIDE 20 MG PO TABS
20.0000 mg | ORAL_TABLET | Freq: Every day | ORAL | 3 refills | Status: AC | PRN
  Filled 2024-07-12: qty 30, 30d supply, fill #0
  Filled 2024-08-18: qty 30, 30d supply, fill #1

## 2024-07-12 NOTE — Progress Notes (Signed)
 Chief Complaint  Patient presents with   Medical Management of Chronic Issues    Three month follow-up    Discussed the use of AI scribe software for clinical note transcription with the patient, who gave verbal consent to proceed.  History of Present Illness Kristin Leonard is a 73 year old female with PMHx significant for HLD, hypothyroidism, anxiety, and PSVT who presents today for follow up.  She is here for a follow-up visit after medication changes made on May 28th. She was started on Abilify  2 mg in addition to her ongoing medications, fluoxetine  20 mg and alprazolam  0.5 mg as needed. She notes a positive change with the addition of Abilify , with improved ability to redirect her thoughts and manage emotions. She continues to experience vivid dreams but states her sleep has improved, now averaging 8-9 hours per night.     07/12/2024    2:46 PM 02/16/2024   10:07 AM 09/19/2022    9:05 AM 09/02/2022    3:17 PM 12/11/2021    1:55 PM  Depression screen PHQ 2/9  Decreased Interest 1 0 0 0 0  Down, Depressed, Hopeless 1 1 0 0 1  PHQ - 2 Score 2 1 0 0 1  Altered sleeping 2  0 1 1  Tired, decreased energy 2  0 2 1  Change in appetite 0  0 0 0  Feeling bad or failure about yourself  1  0 0 1  Trouble concentrating 1  0 0 0  Moving slowly or fidgety/restless 1  0 0 0  Suicidal thoughts 0  0 0 0  PHQ-9 Score 9  0 3 4  Difficult doing work/chores Not difficult at all  Not difficult at all Somewhat difficult Not difficult at all      07/12/2024    2:47 PM  GAD 7 : Generalized Anxiety Score  Nervous, Anxious, on Edge 1  Control/stop worrying 2  Worry too much - different things 2  Trouble relaxing 1  Restless 0  Easily annoyed or irritable 1  Afraid - awful might happen 2  Total GAD 7 Score 9  Anxiety Difficulty Not difficult at all   -She reports that she is still having dry cough, which has been ongoing since before May.  She started omeprazole 40 mg daily, which has  slightly improved the cough but not resolved it.  A chest X-ray in 04/2024 showed no abnormalities.  Negative for fever,abdominal wt loss.  -She notes some fluid retention, with swelling in her ankles and hands, requiring her to remove her ring.  No orthopnea or PND but she reports an expiratory wheeze in the morning, described as a 'whistle' in the upper airway. Her husband has noted gurgling sounds at night. She uses albuterol  as needed and Symbicort  for asthma, which help. No witnessed OSA.  No significant shortness of breath with daily activities but mild shortness of breath when climbing stairs. She has not been exercising regularly due to lack of energy. She is trying to be consistent following a healthful diet.  Review of Systems  Constitutional:  Positive for fatigue. Negative for activity change, appetite change and fever.  HENT:  Negative for sore throat and trouble swallowing.   Gastrointestinal:  Negative for abdominal pain, nausea and vomiting.  Endocrine: Negative for cold intolerance and heat intolerance.  Genitourinary:  Negative for decreased urine volume, dysuria and hematuria.  Allergic/Immunologic: Positive for environmental allergies.  Neurological:  Negative for syncope, weakness and headaches.  Psychiatric/Behavioral:  Negative for confusion and hallucinations.   See other pertinent positives and negatives in HPI.  Current Outpatient Medications on File Prior to Visit  Medication Sig Dispense Refill   albuterol  (VENTOLIN  HFA) 108 (90 Base) MCG/ACT inhaler Inhale 2 puffs into the lungs every 6 (six) hours as needed for wheezing. 6.7 g 4   ALPRAZolam  (XANAX ) 0.5 MG tablet Take 0.5-1 tablets (0.25-0.5 mg total) by mouth at bedtime as needed for anxiety. 30 tablet 3   amoxicillin -clavulanate (AUGMENTIN ) 875-125 MG tablet Take 1 tablet by mouth 2 (two) times daily. 14 tablet 0   ARIPiprazole  (ABILIFY ) 2 MG tablet Take 1 tablet (2 mg total) by mouth daily. 30 tablet 3    augmented betamethasone  dipropionate (DIPROLENE -AF) 0.05 % cream Apply a small amount to affected area of hands twice a day for 1-2 weeks as directed 50 g 1   budesonide -formoterol  (SYMBICORT ) 80-4.5 MCG/ACT inhaler Inhale 2 puffs into the lungs up to 12 times a day as needed as directed (30 days) 10.2 g 3   cyanocobalamin  (DODEX ) 1000 MCG/ML injection Inject 1 ml into the muscle every 5 weeks. 3 mL 6   diltiazem  (CARDIZEM  CD) 180 MG 24 hr capsule Take 1 capsule (180 mg total) by mouth daily. 90 capsule 2   fluorometholone  (FML) 0.1 % ophthalmic suspension Place 1 drop into both eyes twice a day 5 mL 0   FLUoxetine  (PROZAC ) 20 MG capsule Take 1 capsule (20 mg total) by mouth daily. 90 capsule 2   fluticasone  (FLONASE ) 50 MCG/ACT nasal spray Use 1-2 sprays in each nostril once a day or as needed 16 g 5   hydrocortisone  2.5 % cream Apply a small amount to affected area twice a day for 1-2 weeks(eyelids and face). 30 g 1   icosapent  Ethyl (VASCEPA ) 1 g capsule Take 1 capsule by mouth 2 times daily. 180 capsule 1   meclizine  (ANTIVERT ) 25 MG tablet Take 1 tablet (25 mg total) by mouth 2 (two) times daily as needed for dizziness. 30 tablet 1   omeprazole (PRILOSEC OTC) 20 MG tablet Take 20 mg by mouth daily.     propranolol  (INDERAL ) 40 MG tablet TAKE 1 TABLET BY MOUTH 3 TIMES DAILY AS NEEDED 90 tablet 3   simvastatin  (ZOCOR ) 20 MG tablet Take 1 tablet (20 mg total) by mouth at bedtime. 90 tablet 2   sodium fluoride  (PREVIDENT 5000 DRY MOUTH) 1.1 % GEL dental gel Use to brush teeth 2 times daily at 12 noon and 4 pm. 100 mL 5   Spacer/Aero-Holding Chambers (AEROCHAMBER PLUS) Device To use with inhalers. 1 each 0   SYNTHROID  100 MCG tablet Take 1 tablet (100 mcg total) by mouth daily before breakfast. 90 tablet 1   triamcinolone  cream (KENALOG ) 0.1 % Apply 1 gram to affected area twice a day 80 g 1   No current facility-administered medications on file prior to visit.   Past Medical History:  Diagnosis  Date   ASTHMA 04/26/2007   CORONARY ARTERY SPASM 04/26/2007   HYPOTHYROIDISM 02/07/2010   NECK PAIN 02/01/2008   SUPRAVENTRICULAR TACHYCARDIA, HX OF 04/26/2007   Allergies  Allergen Reactions   No Known Allergies    Social History   Socioeconomic History   Marital status: Married    Spouse name: Not on file   Number of children: Not on file   Years of education: Not on file   Highest education level: Not on file  Occupational History   Not on file  Tobacco Use   Smoking status: Never   Smokeless tobacco: Never  Substance and Sexual Activity   Alcohol use: Yes    Alcohol/week: 3.0 standard drinks of alcohol    Types: 3 Glasses of wine per week   Drug use: No   Sexual activity: Not on file  Other Topics Concern   Not on file  Social History Narrative   Not on file   Social Drivers of Health   Financial Resource Strain: Low Risk  (09/19/2022)   Overall Financial Resource Strain (CARDIA)    Difficulty of Paying Living Expenses: Not hard at all  Food Insecurity: No Food Insecurity (09/19/2022)   Hunger Vital Sign    Worried About Running Out of Food in the Last Year: Never true    Ran Out of Food in the Last Year: Never true  Transportation Needs: No Transportation Needs (09/19/2022)   PRAPARE - Administrator, Civil Service (Medical): No    Lack of Transportation (Non-Medical): No  Physical Activity: Insufficiently Active (09/19/2022)   Exercise Vital Sign    Days of Exercise per Week: 3 days    Minutes of Exercise per Session: 40 min  Stress: No Stress Concern Present (09/19/2022)   [employer] of Occupational Health - Occupational Stress Questionnaire    Feeling of Stress : Not at all  Social Connections: Socially Integrated (09/19/2022)   Social Connection and Isolation Panel    Frequency of Communication with Friends and Family: More than three times a week    Frequency of Social Gatherings with Friends and Family: More than three times a week     Attends Religious Services: More than 4 times per year    Active Member of [employer] or Organizations: Yes    Attends Banker Meetings: More than 4 times per year    Marital Status: Married   Vitals:   07/12/24 1441  BP: 110/70  Pulse: 60  Resp: 16  Temp: 97.9 F (36.6 C)  SpO2: 98%   Body mass index is 36.22 kg/m.  Wt Readings from Last 3 Encounters:  07/12/24 211 lb (95.7 kg)  03/30/24 205 lb (93 kg)  07/15/23 203 lb 6 oz (92.3 kg)   Physical Exam Vitals and nursing note reviewed.  Constitutional:      General: She is not in acute distress.    Appearance: She is well-developed.  HENT:     Head: Normocephalic and atraumatic.     Mouth/Throat:     Mouth: Mucous membranes are moist.     Pharynx: Oropharynx is clear. Uvula midline.  Eyes:     Conjunctiva/sclera: Conjunctivae normal.  Cardiovascular:     Rate and Rhythm: Normal rate and regular rhythm.     Heart sounds: No murmur heard.    Comments: Trace pitting LE edema, bilateral. Pulmonary:     Effort: Pulmonary effort is normal. No respiratory distress.     Breath sounds: Normal breath sounds. No decreased air movement. No wheezing.  Abdominal:     Palpations: Abdomen is soft. There is no mass.     Tenderness: There is no abdominal tenderness.  Musculoskeletal:     Right lower leg: No edema.     Left lower leg: No edema.  Lymphadenopathy:     Cervical: No cervical adenopathy.  Skin:    General: Skin is warm.     Findings: No erythema or rash.  Neurological:     General: No focal deficit present.  Mental Status: She is alert and oriented to person, place, and time.     Cranial Nerves: No cranial nerve deficit.     Gait: Gait normal.  Psychiatric:        Mood and Affect: Mood and affect normal.   ASSESSMENT AND PLAN:  Ms. Sandrina was seen today for medical management of chronic issues.  Diagnoses and all orders for this visit:  Orders Placed This Encounter  Procedures   CT Chest Wo  Contrast   Basic metabolic panel with GFR   Brain Natriuretic Peptide   Lab Results  Component Value Date   NA 139 07/12/2024   CL 105 07/12/2024   K 3.9 07/12/2024   CO2 25 07/12/2024   BUN 15 07/12/2024   CREATININE 0.84 07/12/2024   GFR 69.65 07/12/2024   CALCIUM  9.8 07/12/2024   ALBUMIN 4.3 01/06/2024   GLUCOSE 78 07/12/2024   Mild intermittent asthma without complication Assessment & Plan: Currently on Symbicort  160-4.5 mcg bid and Albuterol  inh. Having some am wheezing, no limitation of daily activities. No changes today.  Bilateral lower extremity edema Assessment & Plan: Mild. We discussed possible causes, most likely related to venous insufficiency. After reviewing some side effects of diuretics, she is interested in trying. Recommend Furosemide  20 mg daily as needed. LE elevation and compression stocking.  Orders: -     Basic metabolic panel with GFR; Future -     Brain natriuretic peptide -     Furosemide ; Take 1 tablet (20 mg total) by mouth daily as needed.  Dispense: 30 tablet; Refill: 3  Persistent cough Problem has been persistent sine 03/2024. Some of her chronic co morbilities could be contributing factors. PPI helped some. CXR 04/05/24 was negative. Chest CT will be arranged. I have placed referral to pulmonologist, Dr Shelah.  -     CT CHEST WO CONTRAST; Future  Morbid obesity (HCC) with BMI 36.2 and asthma,HLD, GERD,SVT Assessment & Plan: With hx of GERD, HLD, and depression among some. She has gained some wt since her last visit, 03/2024. She is trying to go back to regular physical activity. She understands the benefits of wt loss as well as adverse effects of obesity. Consistency with following a healthful diet encouraged.  Anxiety disorder, unspecified type Assessment & Plan: Problem has been stable. Continue Fluoxetine  20 mg daily and alprazolam  0.5 mg daily as needed. F/U in 5-6 months.  Depression, major, recurrent, mild  (HCC) Assessment & Plan: She reports improvement after adding Abilify  low dose. Continue Fluoxetine  20 mg daily and Abilify  2 mg daily.  Return in about 6 months (around 01/09/2025).  Roxana Lai G. [place], MD  [facility]. [place] office.

## 2024-07-12 NOTE — Patient Instructions (Addendum)
 A few things to remember from today's visit:  Depression, major, recurrent, mild (HCC)  Anxiety disorder, unspecified type  Persistent cough - Plan: CT Chest Wo Contrast  Bilateral lower extremity edema - Plan: Basic metabolic panel with GFR, Brain Natriuretic Peptide, furosemide  (LASIX ) 20 MG tablet Furosemide  daily as needed, lower extremity elevation,and compression stocking for edema.  If you need refills for medications you take chronically, please call your pharmacy. Do not use My Chart to request refills or for acute issues that need immediate attention. If you send a my chart message, it may take a few days to be addressed, specially if I am not in the office.  Please be sure medication list is accurate. If a new problem present, please set up appointment sooner than planned today.

## 2024-07-13 ENCOUNTER — Ambulatory Visit: Payer: Self-pay | Admitting: Family Medicine

## 2024-07-13 ENCOUNTER — Other Ambulatory Visit (HOSPITAL_COMMUNITY): Payer: Self-pay

## 2024-07-13 ENCOUNTER — Other Ambulatory Visit: Payer: Self-pay

## 2024-07-13 LAB — BASIC METABOLIC PANEL WITH GFR
BUN: 15 mg/dL (ref 6–23)
CO2: 25 meq/L (ref 19–32)
Calcium: 9.8 mg/dL (ref 8.4–10.5)
Chloride: 105 meq/L (ref 96–112)
Creatinine, Ser: 0.84 mg/dL (ref 0.40–1.20)
GFR: 69.65 mL/min (ref >60.00)
Glucose, Bld: 78 mg/dL (ref 70–99)
Potassium: 3.9 meq/L (ref 3.5–5.1)
Sodium: 139 meq/L (ref 135–145)

## 2024-07-13 LAB — BRAIN NATRIURETIC PEPTIDE: Pro B Natriuretic peptide (BNP): 58 pg/mL (ref 0.0–100.0)

## 2024-07-14 NOTE — Assessment & Plan Note (Signed)
 She reports improvement after adding Abilify  low dose. Continue Fluoxetine  20 mg daily and Abilify  2 mg daily.

## 2024-07-14 NOTE — Assessment & Plan Note (Addendum)
 Mild. We discussed possible causes, most likely related to venous insufficiency. After reviewing some side effects of diuretics, she is interested in trying. Recommend Furosemide  20 mg daily as needed. LE elevation and compression stocking.

## 2024-07-14 NOTE — Assessment & Plan Note (Signed)
 Problem has been stable. Continue Fluoxetine  20 mg daily and alprazolam  0.5 mg daily as needed. F/U in 5-6 months.

## 2024-07-14 NOTE — Assessment & Plan Note (Signed)
 Currently on Symbicort  160-4.5 mcg bid and Albuterol  inh. Having some am wheezing, no limitation of daily activities. No changes today.

## 2024-07-14 NOTE — Assessment & Plan Note (Addendum)
 With hx of GERD, HLD, and depression among some. She has gained some wt since her last visit, 03/2024. She is trying to go back to regular physical activity. She understands the benefits of wt loss as well as adverse effects of obesity. Consistency with following a healthful diet encouraged.

## 2024-07-18 ENCOUNTER — Encounter: Payer: Self-pay | Admitting: Family Medicine

## 2024-08-18 ENCOUNTER — Other Ambulatory Visit: Payer: Self-pay

## 2024-08-18 ENCOUNTER — Other Ambulatory Visit: Payer: Self-pay | Admitting: Family Medicine

## 2024-08-18 ENCOUNTER — Other Ambulatory Visit (HOSPITAL_COMMUNITY): Payer: Self-pay

## 2024-08-18 DIAGNOSIS — E890 Postprocedural hypothyroidism: Secondary | ICD-10-CM

## 2024-08-18 MED ORDER — SYNTHROID 100 MCG PO TABS
100.0000 ug | ORAL_TABLET | Freq: Every day | ORAL | 1 refills | Status: AC
  Filled 2024-08-18: qty 90, 90d supply, fill #0
  Filled 2024-11-21: qty 90, 90d supply, fill #1

## 2024-09-07 DIAGNOSIS — H2513 Age-related nuclear cataract, bilateral: Secondary | ICD-10-CM | POA: Diagnosis not present

## 2024-09-07 DIAGNOSIS — H43393 Other vitreous opacities, bilateral: Secondary | ICD-10-CM | POA: Diagnosis not present

## 2024-09-07 DIAGNOSIS — H04123 Dry eye syndrome of bilateral lacrimal glands: Secondary | ICD-10-CM | POA: Diagnosis not present

## 2024-09-12 ENCOUNTER — Other Ambulatory Visit: Payer: Self-pay

## 2024-09-12 ENCOUNTER — Other Ambulatory Visit: Payer: Self-pay | Admitting: Family Medicine

## 2024-09-12 ENCOUNTER — Other Ambulatory Visit (HOSPITAL_COMMUNITY): Payer: Self-pay

## 2024-09-12 MED ORDER — ALBUTEROL SULFATE HFA 108 (90 BASE) MCG/ACT IN AERS
2.0000 | INHALATION_SPRAY | Freq: Four times a day (QID) | RESPIRATORY_TRACT | 4 refills | Status: AC | PRN
  Filled 2024-09-12: qty 6.7, 25d supply, fill #0

## 2024-09-22 ENCOUNTER — Other Ambulatory Visit: Payer: Self-pay | Admitting: Family Medicine

## 2024-09-22 ENCOUNTER — Other Ambulatory Visit (HOSPITAL_COMMUNITY): Payer: Self-pay

## 2024-09-22 DIAGNOSIS — F330 Major depressive disorder, recurrent, mild: Secondary | ICD-10-CM

## 2024-09-22 MED ORDER — ARIPIPRAZOLE 2 MG PO TABS
2.0000 mg | ORAL_TABLET | Freq: Every day | ORAL | 3 refills | Status: AC
  Filled 2024-09-22: qty 30, 30d supply, fill #0
  Filled 2024-10-12 – 2024-10-21 (×2): qty 30, 30d supply, fill #1
  Filled 2024-11-21: qty 30, 30d supply, fill #2

## 2024-10-12 ENCOUNTER — Other Ambulatory Visit: Payer: Self-pay

## 2024-10-12 ENCOUNTER — Other Ambulatory Visit: Payer: Self-pay | Admitting: Family Medicine

## 2024-10-12 ENCOUNTER — Other Ambulatory Visit (HOSPITAL_COMMUNITY): Payer: Self-pay

## 2024-10-12 DIAGNOSIS — E782 Mixed hyperlipidemia: Secondary | ICD-10-CM

## 2024-10-12 MED ORDER — SIMVASTATIN 20 MG PO TABS
20.0000 mg | ORAL_TABLET | Freq: Every day | ORAL | 2 refills | Status: AC
  Filled 2024-10-12: qty 90, 90d supply, fill #0

## 2024-11-21 ENCOUNTER — Other Ambulatory Visit (HOSPITAL_COMMUNITY): Payer: Self-pay

## 2024-11-21 ENCOUNTER — Other Ambulatory Visit: Payer: Self-pay | Admitting: Family Medicine

## 2024-11-21 DIAGNOSIS — E782 Mixed hyperlipidemia: Secondary | ICD-10-CM

## 2024-11-21 MED ORDER — ICOSAPENT ETHYL 1 G PO CAPS
1.0000 g | ORAL_CAPSULE | Freq: Two times a day (BID) | ORAL | 1 refills | Status: AC
  Filled 2024-11-21 – 2024-11-23 (×2): qty 180, 90d supply, fill #0

## 2024-11-23 ENCOUNTER — Other Ambulatory Visit (HOSPITAL_COMMUNITY): Payer: Self-pay

## 2025-01-09 ENCOUNTER — Ambulatory Visit: Admitting: Family Medicine
